# Patient Record
Sex: Male | Born: 1960
Health system: Southern US, Community
[De-identification: ages and names within clinical notes are randomized; demographics above are authoritative.]

## PROBLEM LIST (undated history)

## (undated) DIAGNOSIS — N471 Phimosis: Secondary | ICD-10-CM

## (undated) DIAGNOSIS — I10 Essential (primary) hypertension: Secondary | ICD-10-CM

## (undated) DIAGNOSIS — M1990 Unspecified osteoarthritis, unspecified site: Secondary | ICD-10-CM

## (undated) DIAGNOSIS — R7303 Prediabetes: Secondary | ICD-10-CM

## (undated) DIAGNOSIS — R3129 Other microscopic hematuria: Secondary | ICD-10-CM

---

## 1999-07-20 ENCOUNTER — Ambulatory Visit (HOSPITAL_BASED_OUTPATIENT_CLINIC_OR_DEPARTMENT_OTHER): Admission: RE | Admit: 1999-07-20 | Discharge: 1999-07-20 | Payer: Self-pay | Admitting: Urology

## 2000-01-23 ENCOUNTER — Other Ambulatory Visit: Admission: RE | Admit: 2000-01-23 | Discharge: 2000-01-23 | Payer: Self-pay | Admitting: Urology

## 2001-02-10 ENCOUNTER — Other Ambulatory Visit: Admission: RE | Admit: 2001-02-10 | Discharge: 2001-02-10 | Payer: Self-pay | Admitting: Urology

## 2001-06-07 ENCOUNTER — Emergency Department (HOSPITAL_COMMUNITY): Admission: EM | Admit: 2001-06-07 | Discharge: 2001-06-07 | Payer: Self-pay | Admitting: Emergency Medicine

## 2001-08-27 ENCOUNTER — Other Ambulatory Visit: Admission: RE | Admit: 2001-08-27 | Discharge: 2001-08-27 | Payer: Self-pay | Admitting: Urology

## 2005-06-28 ENCOUNTER — Ambulatory Visit: Admission: RE | Admit: 2005-06-28 | Discharge: 2005-06-28 | Payer: Self-pay | Admitting: Internal Medicine

## 2005-09-20 ENCOUNTER — Encounter: Admission: RE | Admit: 2005-09-20 | Discharge: 2005-09-20 | Payer: Self-pay | Admitting: Family Medicine

## 2012-11-25 ENCOUNTER — Other Ambulatory Visit: Payer: Self-pay | Admitting: Urology

## 2012-12-10 ENCOUNTER — Encounter (HOSPITAL_BASED_OUTPATIENT_CLINIC_OR_DEPARTMENT_OTHER): Payer: Self-pay | Admitting: *Deleted

## 2012-12-10 NOTE — Progress Notes (Signed)
NPO AFTER MN. ARRIVES AT 0730. NEEDS ISTAT AND EKG.  

## 2012-12-15 ENCOUNTER — Ambulatory Visit (HOSPITAL_BASED_OUTPATIENT_CLINIC_OR_DEPARTMENT_OTHER)
Admission: RE | Admit: 2012-12-15 | Discharge: 2012-12-15 | Disposition: A | Discharge status: Home / Self Care | Payer: 59 | Source: Ambulatory Visit | Attending: Urology | Admitting: Urology

## 2012-12-15 ENCOUNTER — Encounter (HOSPITAL_BASED_OUTPATIENT_CLINIC_OR_DEPARTMENT_OTHER)
Admission: RE | Disposition: A | Discharge status: Home / Self Care | Payer: Self-pay | Source: Ambulatory Visit | Attending: Urology

## 2012-12-15 ENCOUNTER — Ambulatory Visit (HOSPITAL_BASED_OUTPATIENT_CLINIC_OR_DEPARTMENT_OTHER): Payer: 59 | Admitting: Anesthesiology

## 2012-12-15 ENCOUNTER — Encounter (HOSPITAL_BASED_OUTPATIENT_CLINIC_OR_DEPARTMENT_OTHER): Payer: Self-pay | Admitting: Anesthesiology

## 2012-12-15 DIAGNOSIS — Z79899 Other long term (current) drug therapy: Secondary | ICD-10-CM | POA: Insufficient documentation

## 2012-12-15 DIAGNOSIS — N476 Balanoposthitis: Secondary | ICD-10-CM | POA: Insufficient documentation

## 2012-12-15 DIAGNOSIS — I10 Essential (primary) hypertension: Secondary | ICD-10-CM | POA: Insufficient documentation

## 2012-12-15 DIAGNOSIS — N471 Phimosis: Secondary | ICD-10-CM | POA: Insufficient documentation

## 2012-12-15 DIAGNOSIS — N478 Other disorders of prepuce: Secondary | ICD-10-CM | POA: Insufficient documentation

## 2012-12-15 HISTORY — DX: Prediabetes: R73.03

## 2012-12-15 HISTORY — DX: Unspecified osteoarthritis, unspecified site: M19.90

## 2012-12-15 HISTORY — DX: Phimosis: N47.1

## 2012-12-15 HISTORY — DX: Other microscopic hematuria: R31.29

## 2012-12-15 HISTORY — PX: CIRCUMCISION: SHX1350

## 2012-12-15 HISTORY — DX: Essential (primary) hypertension: I10

## 2012-12-15 LAB — POCT I-STAT, CHEM 8
Creatinine, Ser: 0.9 mg/dL (ref 0.50–1.35)
Glucose, Bld: 110 mg/dL — ABNORMAL HIGH (ref 70–99)
HCT: 46 % (ref 39.0–52.0)
Hemoglobin: 15.6 g/dL (ref 13.0–17.0)
Potassium: 4.1 mEq/L (ref 3.5–5.1)
Sodium: 142 mEq/L (ref 135–145)
TCO2: 28 mmol/L (ref 0–100)

## 2012-12-15 SURGERY — CIRCUMCISION, ADULT
Anesthesia: General | Site: Penis | Wound class: Clean

## 2012-12-15 MED ORDER — PROPOFOL 10 MG/ML IV BOLUS
INTRAVENOUS | Status: DC | PRN
  Administered 2012-12-15: 200 mg via INTRAVENOUS

## 2012-12-15 MED ORDER — MIDAZOLAM HCL 5 MG/5ML IJ SOLN
INTRAMUSCULAR | Status: DC | PRN
  Administered 2012-12-15: 2 mg via INTRAVENOUS

## 2012-12-15 MED ORDER — PROMETHAZINE HCL 25 MG/ML IJ SOLN
6.2500 mg | INTRAMUSCULAR | Status: DC | PRN
  Filled 2012-12-15: qty 1

## 2012-12-15 MED ORDER — BACITRACIN-NEOMYCIN-POLYMYXIN 400-5-5000 EX OINT
TOPICAL_OINTMENT | CUTANEOUS | Status: DC | PRN
  Administered 2012-12-15: 1 via TOPICAL

## 2012-12-15 MED ORDER — ACETAMINOPHEN 10 MG/ML IV SOLN
1000.0000 mg | Freq: Once | INTRAVENOUS | Status: DC | PRN
  Filled 2012-12-15: qty 100

## 2012-12-15 MED ORDER — MEPERIDINE HCL 25 MG/ML IJ SOLN
6.2500 mg | INTRAMUSCULAR | Status: DC | PRN
  Filled 2012-12-15: qty 1

## 2012-12-15 MED ORDER — SODIUM CHLORIDE 0.9 % IR SOLN
Status: DC | PRN
  Administered 2012-12-15: 500 mL

## 2012-12-15 MED ORDER — DEXAMETHASONE SODIUM PHOSPHATE 4 MG/ML IJ SOLN
INTRAMUSCULAR | Status: DC | PRN
  Administered 2012-12-15: 10 mg via INTRAVENOUS

## 2012-12-15 MED ORDER — OXYCODONE HCL 5 MG PO TABS
5.0000 mg | ORAL_TABLET | Freq: Once | ORAL | Status: DC | PRN
  Filled 2012-12-15: qty 1

## 2012-12-15 MED ORDER — BUPIVACAINE HCL (PF) 0.25 % IJ SOLN
INTRAMUSCULAR | Status: DC | PRN
  Administered 2012-12-15: 10 mL

## 2012-12-15 MED ORDER — LACTATED RINGERS IV SOLN
INTRAVENOUS | Status: DC
  Administered 2012-12-15 (×3): via INTRAVENOUS
  Filled 2012-12-15: qty 1000

## 2012-12-15 MED ORDER — FENTANYL CITRATE 0.05 MG/ML IJ SOLN
INTRAMUSCULAR | Status: DC | PRN
  Administered 2012-12-15: 100 ug via INTRAVENOUS

## 2012-12-15 MED ORDER — OXYCODONE HCL 5 MG/5ML PO SOLN
5.0000 mg | Freq: Once | ORAL | Status: DC | PRN
  Filled 2012-12-15: qty 5

## 2012-12-15 MED ORDER — ONDANSETRON HCL 4 MG/2ML IJ SOLN
INTRAMUSCULAR | Status: DC | PRN
  Administered 2012-12-15: 4 mg via INTRAVENOUS

## 2012-12-15 MED ORDER — HYDROMORPHONE HCL PF 1 MG/ML IJ SOLN
0.2500 mg | INTRAMUSCULAR | Status: DC | PRN
  Filled 2012-12-15: qty 1

## 2012-12-15 SURGICAL SUPPLY — 25 items
BANDAGE CO FLEX L/F 1IN X 5YD (GAUZE/BANDAGES/DRESSINGS) IMPLANT
BANDAGE CONFORM 2  STR LF (GAUZE/BANDAGES/DRESSINGS) ×4 IMPLANT
BLADE SURG 15 STRL LF DISP TIS (BLADE) ×1 IMPLANT
BLADE SURG 15 STRL SS (BLADE) ×1
BNDG COHESIVE 1X5 TAN STRL LF (GAUZE/BANDAGES/DRESSINGS) ×2 IMPLANT
CLOTH BEACON ORANGE TIMEOUT ST (SAFETY) ×2 IMPLANT
COVER MAYO STAND STRL (DRAPES) ×2 IMPLANT
COVER TABLE BACK 60X90 (DRAPES) ×2 IMPLANT
DRAPE PED LAPAROTOMY (DRAPES) ×2 IMPLANT
ELECT REM PT RETURN 9FT ADLT (ELECTROSURGICAL) ×2
ELECTRODE REM PT RTRN 9FT ADLT (ELECTROSURGICAL) ×1 IMPLANT
GAUZE VASELINE 1X8 (GAUZE/BANDAGES/DRESSINGS) ×2 IMPLANT
GLOVE BIO SURGEON STRL SZ7 (GLOVE) ×4 IMPLANT
GLOVE INDICATOR 6.5 STRL GRN (GLOVE) ×2 IMPLANT
GLOVE INDICATOR 7.0 STRL GRN (GLOVE) ×2 IMPLANT
GOWN PREVENTION PLUS LG XLONG (DISPOSABLE) ×2 IMPLANT
NEEDLE HYPO 25X1 1.5 SAFETY (NEEDLE) ×2 IMPLANT
PACK BASIN DAY SURGERY FS (CUSTOM PROCEDURE TRAY) ×2 IMPLANT
PENCIL BUTTON HOLSTER BLD 10FT (ELECTRODE) ×2 IMPLANT
SUT CHROMIC 4 0 P 3 18 (SUTURE) IMPLANT
SUT CHROMIC 5 0 RB 1 27 (SUTURE) IMPLANT
SYR CONTROL 10ML LL (SYRINGE) ×2 IMPLANT
TOWEL OR 17X24 6PK STRL BLUE (TOWEL DISPOSABLE) ×4 IMPLANT
TRAY DSU PREP LF (CUSTOM PROCEDURE TRAY) ×2 IMPLANT
WATER STERILE IRR 500ML POUR (IV SOLUTION) ×2 IMPLANT

## 2012-12-15 NOTE — Op Note (Signed)
Alfred Tucker is a 52 y.o.   12/15/2012  Pre-op diagnosis: Phimosis, balanitis  Postop diagnosis: Same  Procedure done: Circumcision  Surgeon: Wendie Simmer. Alfred Tucker  Anesthesia: General  Indication: Patient is a 52 years old male who has been complaining of difficulty retracting his foreskin for several years. He also has been noticing some laceration on the foreskin. He was found physical examination to have phimosis. He is scheduled today for circumcision  Procedure: The patient was identified by his wrist band and proper timeout was taken.  Under general anesthesia he was prepped and draped and placed in the supine position. A penile block was done with 0.25% Marcaine. Then 2 circumferential incisions were made on the foreskin and the foreskin in between those 2 incisions was excised. A frenulotomy was done. Hemostasis was secured with electrocautery. Skin approximation was then done with #4-0 chromic. Sterile eye dressing was then applied to the wound.  The patient tolerated the procedure well and left the OR in satisfactory condition to postanesthesia care unit.  CC: Dr. Renaye Rakers

## 2012-12-15 NOTE — Anesthesia Preprocedure Evaluation (Addendum)
Anesthesia Evaluation  Patient identified by MRN, date of birth, ID band Patient awake    Reviewed: Allergy & Precautions, H&P , NPO status , Patient's Chart, lab work & pertinent test results  Airway Mallampati: II TM Distance: >3 FB Neck ROM: Full    Dental  (+) Dental Advisory Given and Teeth Intact   Pulmonary neg pulmonary ROS,  breath sounds clear to auscultation        Cardiovascular hypertension, Pt. on medications Rhythm:Regular Rate:Normal     Neuro/Psych negative neurological ROS  negative psych ROS   GI/Hepatic negative GI ROS, Neg liver ROS,   Endo/Other  negative endocrine ROS  Renal/GU negative Renal ROS     Musculoskeletal negative musculoskeletal ROS (+)   Abdominal   Peds  Hematology negative hematology ROS (+)   Anesthesia Other Findings   Reproductive/Obstetrics                          Anesthesia Physical Anesthesia Plan  ASA: II  Anesthesia Plan: General   Post-op Pain Management:    Induction: Intravenous  Airway Management Planned: LMA  Additional Equipment:   Intra-op Plan:   Post-operative Plan: Extubation in OR  Informed Consent: I have reviewed the patients History and Physical, chart, labs and discussed the procedure including the risks, benefits and alternatives for the proposed anesthesia with the patient or authorized representative who has indicated his/her understanding and acceptance.   Dental advisory given  Plan Discussed with: CRNA  Anesthesia Plan Comments:         Anesthesia Quick Evaluation

## 2012-12-15 NOTE — Anesthesia Procedure Notes (Signed)
Procedure Name: LMA Insertion Date/Time: 12/15/2012 8:50 AM Performed by: Norva Pavlov Pre-anesthesia Checklist: Patient identified, Emergency Drugs available, Suction available and Patient being monitored Patient Re-evaluated:Patient Re-evaluated prior to inductionOxygen Delivery Method: Circle System Utilized Preoxygenation: Pre-oxygenation with 100% oxygen Intubation Type: IV induction Ventilation: Mask ventilation without difficulty LMA: LMA inserted LMA Size: 4.0 Number of attempts: 1 Airway Equipment and Method: bite block Placement Confirmation: positive ETCO2 Tube secured with: Tape Dental Injury: Teeth and Oropharynx as per pre-operative assessment

## 2012-12-15 NOTE — Anesthesia Postprocedure Evaluation (Signed)
Anesthesia Post Note  Patient: Alfred Tucker  Procedure(s) Performed: Procedure(s) (LRB): CIRCUMCISION ADULT (N/A)  Anesthesia type: General  Patient location: PACU  Post pain: Pain level controlled  Post assessment: Post-op Vital signs reviewed  Last Vitals: BP 137/82  Pulse 57  Temp(Src) 36.1 C (Oral)  Resp 18  Ht 5\' 6"  (1.676 m)  Wt 180 lb (81.647 kg)  BMI 29.07 kg/m2  SpO2 100%  Post vital signs: Reviewed  Level of consciousness: sedated  Complications: No apparent anesthesia complications

## 2012-12-15 NOTE — H&P (Signed)
History of Present Illness  [name] is referred by Dr Renaye Rakers for consultation regarding circumcision.  He has been complaining of difficulty retracting his foreskin and lacerations of the foreskin for several years.  Hygiene is also an issue.  He wishes to have a circumcision.  He had requested to have it six years ago but he then changed his mind.  His symptoms are getting worse.   Current Meds 1. Enalapril Maleate 20 MG Oral Tablet; Therapy: 14Jan2014 to  Allergies Medication  1. Novocain SOLN  Family History Problems  1. Family history of  Blood In Urine 2. Paternal history of  Family Health Status - Mother's Age 41. Paternal history of  Family Health Status Number Of Children 4. Paternal history of  Father Deceased At Age ____ 5. Family history of  Nephrolithiasis 6. Maternal history of  Prostate Cancer V16.42  Social History Problems    Alcohol Use   Caffeine Use   Marital History - Widowed   Occupation:   Tobacco Use 305.1  Review of Systems Genitourinary, constitutional, skin, eye, otolaryngeal, hematologic/lymphatic, cardiovascular, pulmonary, endocrine, musculoskeletal, gastrointestinal, neurological and psychiatric system(s) were reviewed and pertinent findings if present are noted.    Vitals Vital Signs [Data Includes: Last 1 Day]  04Feb2014 02:50PM  BMI Calculated: 30.05 BSA Calculated: 1.95 Height: 5 ft 6 in Weight: 187 lb  Blood Pressure: 128 / 82 Heart Rate: 62 Respiration: 18  Physical Exam Constitutional: Well nourished and well developed . No acute distress.  ENT:. The ears and nose are normal in appearance.  Neck: The appearance of the neck is normal and no neck mass is present.  Pulmonary: No respiratory distress and normal respiratory rhythm and effort.  Cardiovascular: Heart rate and rhythm are normal . No peripheral edema.  Abdomen: The abdomen is soft and nontender. No masses are palpated. No CVA tenderness. No hernias are palpable. No  hepatosplenomegaly noted.  Rectal: Rectal exam demonstrates normal sphincter tone, no tenderness and no masses. The prostate has no nodularity and is not tender. The left seminal vesicle is nonpalpable. The right seminal vesicle is nonpalpable. The perineum is normal on inspection.  Genitourinary: Examination of the penis demonstrates no discharge, no masses, no lesions and a normal meatus. The penis is uncircumcised. The scrotum is without lesions. Examination of the right scrotum demonstrates no hydrocele. Examination of the left scrotum demostrates no hydrocele. The right epididymis is palpably normal and non-tender. The left epididymis is palpably normal and non-tender. The right testis is palpably normal, non-tender and without masses. The left testis is normal, non-tender and without masses.  Lymphatics: The femoral and inguinal nodes are not enlarged or tender.  Skin: Normal skin turgor, no visible rash and no visible skin lesions.  Neuro/Psych:. Mood and affect are appropriate.    Results/Data Urine [Data Includes: Last 1 Day]   04Feb2014  COLOR YELLOW   APPEARANCE CLEAR   SPECIFIC GRAVITY 1.015   pH 8.0   GLUCOSE NEG mg/dL  BILIRUBIN NEG   KETONE NEG mg/dL  BLOOD SMALL   PROTEIN NEG mg/dL  UROBILINOGEN 0.2 mg/dL  NITRITE NEG   LEUKOCYTE ESTERASE NEG   SQUAMOUS EPITHELIAL/HPF RARE   WBC NONE SEEN WBC/hpf  RBC 3-6 RBC/hpf  BACTERIA NONE SEEN   CRYSTALS NONE SEEN   CASTS NONE SEEN    Assessment Assessed  1. Phimosis 605  Plan Health Maintenance (V70.0)  1. UA With REFLEX  Done: 04Feb2014 02:37PM Phimosis (605)  2. Follow-up Schedule Surgery Office  Follow-up  Done: 04Feb2014   Circumcision.  The procedure, risks, benefits were explained to him.  The risks include but are not limited to hemorrhage, infection, hematoma, skin loss.  He understands and wishes to proceed.   Signatures  CC: Dr Renaye Rakers  Electronically signed by : Su Grand, M.D.; Nov 24 2012  3:19PM

## 2012-12-15 NOTE — Transfer of Care (Signed)
Immediate Anesthesia Transfer of Care Note  Patient: Alfred Tucker  Procedure(s) Performed: Procedure(s) (LRB): CIRCUMCISION ADULT (N/A)  Patient Location: PACU  Anesthesia Type: General  Level of Consciousness: sleepy  Airway & Oxygen Therapy: Patient Spontanous Breathing and Patient connected to face mask oxygen  Post-op Assessment: Report given to PACU RN and Post -op Vital signs reviewed and stable  Post vital signs: Reviewed and stable  Complications: No apparent anesthesia complications

## 2012-12-16 ENCOUNTER — Encounter (HOSPITAL_BASED_OUTPATIENT_CLINIC_OR_DEPARTMENT_OTHER): Payer: Self-pay | Admitting: Urology

## 2014-10-07 ENCOUNTER — Ambulatory Visit
Admission: RE | Admit: 2014-10-07 | Discharge: 2014-10-07 | Disposition: A | Discharge status: Home / Self Care | Payer: 59 | Source: Ambulatory Visit | Attending: Family Medicine | Admitting: Family Medicine

## 2014-10-07 ENCOUNTER — Encounter (INDEPENDENT_AMBULATORY_CARE_PROVIDER_SITE_OTHER): Payer: Self-pay

## 2014-10-07 ENCOUNTER — Other Ambulatory Visit: Payer: Self-pay | Admitting: Family Medicine

## 2014-10-07 DIAGNOSIS — R52 Pain, unspecified: Secondary | ICD-10-CM

## 2015-04-26 ENCOUNTER — Ambulatory Visit
Admission: RE | Admit: 2015-04-26 | Discharge: 2015-04-26 | Disposition: A | Discharge status: Home / Self Care | Payer: Commercial Managed Care - HMO | Source: Ambulatory Visit | Attending: Family Medicine | Admitting: Family Medicine

## 2015-04-26 ENCOUNTER — Other Ambulatory Visit: Payer: Self-pay | Admitting: Family Medicine

## 2015-04-26 DIAGNOSIS — M25532 Pain in left wrist: Secondary | ICD-10-CM

## 2015-11-22 ENCOUNTER — Ambulatory Visit (INDEPENDENT_AMBULATORY_CARE_PROVIDER_SITE_OTHER): Payer: Commercial Managed Care - HMO | Admitting: Podiatry

## 2015-11-22 ENCOUNTER — Encounter: Payer: Self-pay | Admitting: Podiatry

## 2015-11-22 VITALS — BP 110/65 | HR 76 | Resp 14

## 2015-11-22 DIAGNOSIS — M129 Arthropathy, unspecified: Secondary | ICD-10-CM | POA: Diagnosis not present

## 2015-11-22 DIAGNOSIS — M19071 Primary osteoarthritis, right ankle and foot: Secondary | ICD-10-CM

## 2015-11-22 MED ORDER — OXAPROZIN 600 MG PO TABS: 600.0000 mg | ORAL_TABLET | Freq: Every day | ORAL | Status: DC

## 2015-11-22 MED ORDER — OXAPROZIN 600 MG PO TABS: 600.0000 mg | ORAL_TABLET | Freq: Two times a day (BID) | ORAL | Status: DC

## 2015-11-22 NOTE — Addendum Note (Signed)
Addended by: Harlon Flor, Chinyere Galiano L on: 11/22/2015 04:18 PM   Modules accepted: Orders

## 2015-11-22 NOTE — Progress Notes (Addendum)
   Subjective:    Patient ID: Alfred Tucker, male    DOB: Oct 27, 1960, 55 y.o.   MRN: [id number]  HPI this patient presents the office with chief complaint of a severely swollen right foot yesterday. He states that the pain has lessened and is approximately 50% of what it was yesterday. He is capable of walking today where as yesterday he was unable to bear weight. He states that one and a half months ago. he was treated by his medical doctor for gout. He states that that improved over time but now the pain has re occurred. He points to the bony prominence on the inside aspect of his right foot as the site of most pain. He also says that he has the inability to raise his big toe about 45 degrees , right foot  before the pain and  the swelling become unbearable.  He has provided no self treatment or sought professional help.  He admits that bloodwork was previously performed and showed no problems.    Review of Systems  All other systems reviewed and are negative.      Objective:   Physical Exam GENERAL APPEARANCE: Alert, conversant. Appropriately groomed. No acute distress.  VASCULAR: Pedal pulses palpable at  [facility] and PT bilateral.  Capillary refill time is immediate to all digits,  Normal temperature gradient.  Digital hair growth is present bilateral  NEUROLOGIC: sensation is normal to 5.07 monofilament at 5/5 sites bilateral.  Light touch is intact bilateral, Muscle strength normal.  MUSCULOSKELETAL: acceptable muscle strength, tone and stability bilateral.  Intrinsic muscluature intact bilateral.  Rectus appearance of foot and digits noted bilateral. Pain and swelling big toe joint right foot with limited ROM.  His whole right foot is swollen with increased swelling dorsum right foot.  Palpable pain at the dorsomedial exostosis right foot.  DERMATOLOGIC: skin color, texture  and turgor are within normal limits.  No preulcerative lesions or ulcers  are seen, no interdigital maceration noted.  No  open lesions present.  Digital nails are asymptomatic. No drainage noted.         Assessment & Plan:  Arthritic flare-up right foot.  Possible gout.   IE  Xrays deferred until Friday.  Treated his acute flare-up with Daypro 600 mg.  # 30. To consider surgical shoe and/or injection therapy. He is allergic to tramadol.   Helane Gunther DPM

## 2015-11-24 ENCOUNTER — Encounter: Payer: Self-pay | Admitting: Podiatry

## 2015-11-24 ENCOUNTER — Ambulatory Visit (INDEPENDENT_AMBULATORY_CARE_PROVIDER_SITE_OTHER): Payer: Commercial Managed Care - HMO

## 2015-11-24 ENCOUNTER — Ambulatory Visit (INDEPENDENT_AMBULATORY_CARE_PROVIDER_SITE_OTHER): Payer: Commercial Managed Care - HMO | Admitting: Podiatry

## 2015-11-24 VITALS — BP 133/66 | HR 64 | Resp 14

## 2015-11-24 DIAGNOSIS — M129 Arthropathy, unspecified: Secondary | ICD-10-CM

## 2015-11-24 DIAGNOSIS — R52 Pain, unspecified: Secondary | ICD-10-CM | POA: Diagnosis not present

## 2015-11-24 DIAGNOSIS — M2011 Hallux valgus (acquired), right foot: Secondary | ICD-10-CM

## 2015-11-24 DIAGNOSIS — M19071 Primary osteoarthritis, right ankle and foot: Secondary | ICD-10-CM

## 2015-11-24 NOTE — Progress Notes (Signed)
Subjective:     Patient ID: Alfred Tucker, male   DOB: May 24, 1961, 55 y.o.   MRN: [id number]  HPI this patient presents to the office 2 days after being seen at [facility]. He was diagnosed with an unknown arthritis affecting and causing pain and swelling to his whole right foot. His pain was centered on the bunion of the right foot. Upon my examination. He was prescribed Daypro to be taken to eliminate the pain and  the swelling. He was also given an appointment at this office for an x-ray . He presents to the office today stating that his right foot is 90% improved. He is very pleased and presents for his x-rays   Review of Systems     Objective:   Physical Exam GENERAL APPEARANCE: Alert, conversant. Appropriately groomed. No acute distress.  VASCULAR: Pedal pulses palpable at  [facility] and PT bilateral.  Capillary refill time is immediate to all digits,  Normal temperature gradient.  Digital hair growth is present bilateral  NEUROLOGIC: sensation is normal to 5.07 monofilament at 5/5 sites bilateral.  Light touch is intact bilateral, Muscle strength normal.  MUSCULOSKELETAL: acceptable muscle strength, tone and stability bilateral.  Intrinsic muscluature intact bilateral.  Rectus appearance of foot and digits noted bilateral. Swelling and redness has subsided.  Pain persists at dorsomedial exostosis right foot.  DERMATOLOGIC: skin color, texture, and turgor are within normal limits.  No preulcerative lesions or ulcers  are seen, no interdigital maceration noted.  No open lesions present.  Digital nails are asymptomatic. No drainage noted.      Assessment:     Hallux  Valgus right foot.  Possible gout     Plan:     ROV  Xray reveal mild radiographic changes.  Told to continue taking the Daypro.  Offered injection therapy but patient declined.  Dispense surgical shoe  To use as needed.   Helane Gunther DPM

## 2015-12-01 ENCOUNTER — Ambulatory Visit (INDEPENDENT_AMBULATORY_CARE_PROVIDER_SITE_OTHER): Payer: Commercial Managed Care - HMO | Admitting: Physician Assistant

## 2015-12-01 VITALS — BP 126/84 | HR 57 | Temp 97.9°F | Resp 18 | Ht 65.5 in | Wt 185.2 lb

## 2015-12-01 DIAGNOSIS — E119 Type 2 diabetes mellitus without complications: Secondary | ICD-10-CM | POA: Diagnosis not present

## 2015-12-01 LAB — COMPREHENSIVE METABOLIC PANEL
ALK PHOS: 35 U/L — AB (ref 40–115)
ALT: 23 U/L (ref 9–46)
AST: 25 U/L (ref 10–35)
Albumin: 4.4 g/dL (ref 3.6–5.1)
BILIRUBIN TOTAL: 0.6 mg/dL (ref 0.2–1.2)
BUN: 12 mg/dL (ref 7–25)
CALCIUM: 9.8 mg/dL (ref 8.6–10.3)
CO2: 28 mmol/L (ref 20–31)
Chloride: 104 mmol/L (ref 98–110)
Creat: 0.9 mg/dL (ref 0.70–1.33)
GLUCOSE: 105 mg/dL — AB (ref 65–99)
Potassium: 5 mmol/L (ref 3.5–5.3)
Sodium: 138 mmol/L (ref 135–146)
TOTAL PROTEIN: 6.7 g/dL (ref 6.1–8.1)

## 2015-12-01 LAB — POCT GLYCOSYLATED HEMOGLOBIN (HGB A1C): HEMOGLOBIN A1C: 130

## 2015-12-01 LAB — GLUCOSE, POCT (MANUAL RESULT ENTRY): POC Glucose: 6.7 mg/dl — AB (ref 70–99)

## 2015-12-01 MED ORDER — METFORMIN HCL 500 MG PO TABS: 500.0000 mg | ORAL_TABLET | Freq: Every day | ORAL | Status: DC

## 2015-12-01 NOTE — Progress Notes (Signed)
[facility] [address], [place] [address] [phone]  Date:  12/01/2015   Name:  Alfred Tucker   DOB:  1960/11/06   MRN:  [id number]  PCP:  Geraldo Pitter, MD    Chief Complaint: Diabetes   History of Present Illness:  This is a 55 y.o. male with PMH HTN who is presenting for a second opinion on diabetes. Sees Dr. Arvella Nigh at [facility]. 2 weeks ago she diagnosed him with DM. States his A1C was 6.7. He was placed on sample of jentadueto XR -- metformin 1000 mg/linagliptin 5 mg QD. States she instructed him to take for 2 weeks and let her know how she did with that. He states in general he did well with it. No GI upset. States yesterday when he stopped the medicine he started feeling dizzy. He stumbled in the bathroom. He went to see the nurse at work and she tested his bg which was 45. She told him she didn't think he had diabetes. He states diabetes runs in his family For the past 4 weeks he has been exercising 4-5 days a week for 45-60 minutes at a time. He has been eating much healthier, avoiding excess carbs and drinking only water. Avoiding sweets.  Just had an eye appt. Denies paresthesias, cp, sob, abdominal pain, blurred vision  Review of Systems:  Review of Systems See HPI  There are no active problems to display for this patient.   Prior to Admission medications   Medication Sig Start Date End Date Taking? Authorizing Provider  enalapril (VASOTEC) 20 MG tablet Take 20 mg by mouth daily.   Yes Historical Provider, MD  oxaprozin (DAYPRO) 600 MG tablet Take 1 tablet (600 mg total) by mouth 2 (two) times daily. 11/22/15  Yes Helane Gunther, DPM    Allergies  Allergen Reactions  . Novocain [Procaine Hcl] Itching  . Tramadol Itching    Past Surgical History  Procedure Laterality Date  . Circumcision N/A 12/15/2012    Procedure: CIRCUMCISION ADULT;  Surgeon: Lindaann Slough, MD;  Location: [facility];  Service: Urology;   Laterality: N/A;    Social History  Substance Use Topics  . Smoking status: Former Smoker -- 0.25 packs/day for 10 years    Types: Cigarettes    Quit date: 12/10/1992  . Smokeless tobacco: Never Used  . Alcohol Use: Yes     Comment: occasional    Family History  Problem Relation Age of Onset  . Diabetes Mother   . Heart disease Mother   . Diabetes Father   . Stroke Sister   . Diabetes Sister   . Stroke Brother   . Diabetes Sister   . Diabetes Brother     Medication list has been reviewed and updated.  Physical Examination:  Physical Exam  Constitutional: He is oriented to person, place, and time. He appears well-developed and well-nourished. No distress.  HENT:  Head: Normocephalic and atraumatic.  Right Ear: Hearing normal.  Left Ear: Hearing normal.  Nose: Nose normal.  Eyes: Conjunctivae and lids are normal. Right eye exhibits no discharge. Left eye exhibits no discharge. No scleral icterus.  Cardiovascular: Normal rate, regular rhythm, normal heart sounds and normal pulses.   No murmur heard. Pulmonary/Chest: Effort normal and breath sounds normal. No respiratory distress. He has no wheezes. He has no rhonchi. He has no rales.  Musculoskeletal: Normal range of motion.  Neurological: He is alert and oriented to person, place, and  time.  Skin: Skin is warm, dry and intact. No lesion and no rash noted.  Psychiatric: He has a normal mood and affect. His speech is normal and behavior is normal. Thought content normal.   BP 126/84 mmHg  Pulse 57  Temp(Src) 97.9 F (36.6 C) (Oral)  Resp 18  Ht 5' 5.5" (1.664 m)  Wt 185 lb 3.2 oz (84.006 kg)  BMI 30.34 kg/m2  SpO2 98%  Results for orders placed or performed in visit on 12/01/15  POCT glycosylated hemoglobin (Hb A1C)  Result Value Ref Range   Hemoglobin A1C 130   POCT glucose (manual entry)  Result Value Ref Range   POC Glucose 6.7 (A) 70 - 99 mg/dl   Assessment and Plan:  1. Type 2 diabetes mellitus  without complication, without long-term current use of insulin (HCC) I do not think pt needs to be on such a strong diabetes medicine with an A1C of only 6.7. He opted to be on low dose metformin -- 500 mg QAM. Counseled on diet and weight loss. Continue exercising regularly. Pt wants to establish care with new PCP but he is not sure if he wants to establish here. Gave 3 months of medicine while he takes time to find a PCP to establish with. - POCT glycosylated hemoglobin (Hb A1C) - POCT glucose (manual entry) - Comprehensive metabolic panel - metFORMIN (GLUCOPHAGE) 500 MG tablet; Take 1 tablet (500 mg total) by mouth daily with breakfast.  Dispense: 90 tablet; Refill: 0   [place] [employer]. Dyke Brackett, MHS [facility]  12/01/2015

## 2015-12-01 NOTE — Patient Instructions (Signed)
Continue with diet and exercise. Foods you wants to avoid are white (potatoes, rice, bread, pasta), sugary beverages and sweets. Take metformin once a day with breakfast. Make an appointment with a primary care provider in 3 months.

## 2016-05-28 ENCOUNTER — Encounter: Payer: Self-pay | Admitting: Podiatry

## 2016-05-28 ENCOUNTER — Ambulatory Visit (INDEPENDENT_AMBULATORY_CARE_PROVIDER_SITE_OTHER): Payer: Commercial Managed Care - HMO | Admitting: Sports Medicine

## 2016-05-28 VITALS — BP 118/72 | HR 60 | Resp 14

## 2016-05-28 DIAGNOSIS — M205X1 Other deformities of toe(s) (acquired), right foot: Secondary | ICD-10-CM | POA: Diagnosis not present

## 2016-05-28 DIAGNOSIS — M19071 Primary osteoarthritis, right ankle and foot: Secondary | ICD-10-CM

## 2016-05-28 DIAGNOSIS — M129 Arthropathy, unspecified: Secondary | ICD-10-CM | POA: Diagnosis not present

## 2016-05-28 DIAGNOSIS — M79671 Pain in right foot: Secondary | ICD-10-CM

## 2016-05-28 DIAGNOSIS — M2040 Other hammer toe(s) (acquired), unspecified foot: Secondary | ICD-10-CM | POA: Diagnosis not present

## 2016-05-28 MED ORDER — METHYLPREDNISOLONE 4 MG PO TBPK: ORAL_TABLET | ORAL | 0 refills | Status: DC

## 2016-05-28 NOTE — Patient Instructions (Signed)
Hallux Rigidus Hallux rigidus is a condition involving pain and a loss of motion of the first (big) toe. The pain gets worse with lifting up (extension) of the toe. This is usually due to arthritic bony bumps (spurring) of the joint at the base of the big toe.  SYMPTOMS   Pain, with lifting up of the toe.  Tenderness over the joint where the big toe meets the foot.  Redness, swelling, and warmth over the top of the base of the big toe (sometimes).  Foot pain, stiffness, and limping. CAUSES  Hallux rigidus is caused by arthritis of the joint where the big toe meets the foot. The arthritis creates a bone spur that pinches the soft tissues when the toe is extended. RISK INCREASES WITH:  Tight shoes with a narrow toe box.  Family history of foot problems.  Gout and rheumatoid and psoriatic arthritis.  History of previous toe injury, including "turf toe."  Long first toe, flat feet, and other big toe bony bumps.  Arthritis of the big toe. PREVENTION   Wear wide-toed shoes that fit well.  Tape the big toe to reduce motion and to prevent pinching of the tissues between the bone.  Maintain physical fitness:  Foot and ankle flexibility.  Muscle strength and endurance. PROGNOSIS  This condition can usually be managed with proper treatment. However, surgery is typically required to prevent the problem from recurring.  RELATED COMPLICATIONS  Injury to other areas of the foot or ankle, caused by abnormal walking in an attempt to avoid the pain felt when walking normally. TREATMENT Treatment first involves stopping the activities that aggravate your symptoms. Ice and medicine can be used to reduce the pain and inflammation. Modifications to shoes may help reduce pain, including wearing stiff-soled shoes, shoes with a wide toe box, inserting a padded donut to relieve pressure on top of the joint, or wearing an arch support. Corticosteroid injections may be given to reduce inflammation. If  nonsurgical treatment is unsuccessful, surgery may be needed. Surgical options include removing the arthritic bony spur, cutting a bone in the foot to change the arc of motion (allowing the toe to extend more), or fusion of the joint (eliminating all motion in the joint at the base of the big toe).  MEDICATION   If pain medicine is needed, nonsteroidal anti-inflammatory medicines (aspirin and ibuprofen), or other minor pain relievers (acetaminophen), are often advised.  Do not take pain medicine for 7 days before surgery.  Prescription pain relievers are usually prescribed only after surgery. Use only as directed and only as much as you need.  Ointments for arthritis, applied to the skin, may give some relief.  Injections of corticosteroids may be given to reduce inflammation. HEAT AND COLD  Cold treatment (icing) relieves pain and reduces inflammation. Cold treatment should be applied for 10 to 15 minutes every 2 to 3 hours, and immediately after activity that aggravates your symptoms. Use ice packs or an ice massage.  Heat treatment may be used before performing the stretching and strengthening activities prescribed by your caregiver, physical therapist, or athletic trainer. Use a heat pack or a warm water soak. SEEK MEDICAL CARE IF:   Symptoms get worse or do not improve in 2 weeks, despite treatment.  After surgery you develop fever, increasing pain, redness, swelling, drainage of fluids, bleeding, or increasing warmth.  New, unexplained symptoms develop. (Drugs used in treatment may produce side effects.)   This information is not intended to replace advice given to   you by your health care provider. Make sure you discuss any questions you have with your health care provider.   Document Released: 10/07/2005 Document Revised: 10/28/2014 Document Reviewed: 01/19/2009 Elsevier Interactive Patient Education 2016 [employer]  

## 2016-05-28 NOTE — Progress Notes (Signed)
Subjective: Alfred Tucker is a 55 y.o. male patient who presents to office for evaluation of Right big toe joint pain and noticing smaller toes curling. Patient complains of continued stiffness and swelling especially over the last year in the Right foot that starts as pain over the bump with direct pressure and range of motion;especially with work. Reports that inserts help and was told by PCP to get new shoes. Patient has been seeing Dr. Stacie Acres who gave anti-inflammatories and recommended injection of which the patient declined. Patient denies any other pedal complaints.   States that he is "borderline diabetic".   There are no active problems to display for this patient.   Current Outpatient Prescriptions on File Prior to Visit  Medication Sig Dispense Refill  . enalapril (VASOTEC) 20 MG tablet Take 20 mg by mouth daily.    . metFORMIN (GLUCOPHAGE) 500 MG tablet Take 1 tablet (500 mg total) by mouth daily with breakfast. 90 tablet 0  . oxaprozin (DAYPRO) 600 MG tablet Take 1 tablet (600 mg total) by mouth 2 (two) times daily. 30 tablet 0   No current facility-administered medications on file prior to visit.     Allergies  Allergen Reactions  . Novocain [Procaine Hcl] Itching  . Tramadol Itching    Objective:  General: Alert and oriented x3 in no acute distress  Dermatology: No open lesions bilateral lower extremities, no webspace macerations, no ecchymosis bilateral, all nails x 10 are well manicured.  Vascular: Dorsalis Pedis and Posterior Tibial pedal pulses 2/4, Capillary Fill Time 3 seconds, (+) pedal hair growth bilateral, no edema bilateral lower extremities, Temperature gradient within normal limits.  Neurology: Michaell Cowing sensation intact via light touch bilateral, Protective sensation intact  with Semmes Weinstein Monofilament to all pedal sites, Position sense intact, vibratory intact bilateral, Deep tendon reflexes within normal limits bilateral, No babinski sign present  bilateral. (-) Tinels sign bilateral.   Musculoskeletal: Mild tenderness with palpation right 1st MTPJ with limited end range of motion suggestive of limitus with early arthritis. Midtarsal, Subtalar joint, and ankle joint range of motion is within normal limits. Mild lesser hammertoe.        Assessment and Plan: Problem List Items Addressed This Visit    None    Visit Diagnoses    Arthritis of foot, right    -  Primary   Relevant Medications   methylPREDNISolone (MEDROL DOSEPAK) 4 MG TBPK tablet   Other Relevant Orders   CBC with Differential   Basic Metabolic Panel   Uric Acid   Sedimentation Rate   C-reactive protein   ANA, IFA Comprehensive Panel   Rheumatoid factor   Hallux limitus of right foot       Relevant Medications   methylPREDNISolone (MEDROL DOSEPAK) 4 MG TBPK tablet   Other Relevant Orders   CBC with Differential   Basic Metabolic Panel   Uric Acid   Sedimentation Rate   C-reactive protein   ANA, IFA Comprehensive Panel   Rheumatoid factor   Hammertoe, unspecified laterality       Relevant Medications   methylPREDNISolone (MEDROL DOSEPAK) 4 MG TBPK tablet   Other Relevant Orders   CBC with Differential   Basic Metabolic Panel   Uric Acid   Sedimentation Rate   C-reactive protein   ANA, IFA Comprehensive Panel   Rheumatoid factor   Right foot pain           -Complete examination performed -Previous Xrays reviewed -Discussed treatement options; discussed Hallux limitus deformity;conservative  and  Surgical management; risks, benefits, alternatives discussed. All patient's questions answered. -Patient declined steroid injection  -Rx Medrol dose pack -Arthritic panel for further eval; will call patient with results -Recommend good supportive shoes and applied felt extension on shoe liner and recommended patient to do the same until he can purchase new inserts -Patient to return to office as needed or sooner if condition worsens.  Asencion Islamitorya Nijee Heatwole, DPM

## 2016-06-04 LAB — CBC WITH DIFFERENTIAL/PLATELET
BASOS PCT: 0 %
Basophils Absolute: 0 cells/uL (ref 0–200)
EOS PCT: 1 %
Eosinophils Absolute: 91 cells/uL (ref 15–500)
HEMATOCRIT: 44.1 % (ref 38.5–50.0)
Hemoglobin: 14.4 g/dL (ref 13.2–17.1)
LYMPHS PCT: 21 %
Lymphs Abs: 1911 cells/uL (ref 850–3900)
MCH: 28.8 pg (ref 27.0–33.0)
MCHC: 32.7 g/dL (ref 32.0–36.0)
MCV: 88.2 fL (ref 80.0–100.0)
MONO ABS: 546 {cells}/uL (ref 200–950)
MONOS PCT: 6 %
MPV: 10.4 fL (ref 7.5–12.5)
Neutro Abs: 6552 cells/uL (ref 1500–7800)
Neutrophils Relative %: 72 %
PLATELETS: 212 10*3/uL (ref 140–400)
RBC: 5 MIL/uL (ref 4.20–5.80)
RDW: 14.4 % (ref 11.0–15.0)
WBC: 9.1 10*3/uL (ref 3.8–10.8)

## 2016-06-04 LAB — BASIC METABOLIC PANEL
BUN: 14 mg/dL (ref 7–25)
CHLORIDE: 106 mmol/L (ref 98–110)
CO2: 29 mmol/L (ref 20–31)
Calcium: 9.6 mg/dL (ref 8.6–10.3)
Creat: 0.88 mg/dL (ref 0.70–1.33)
Glucose, Bld: 101 mg/dL — ABNORMAL HIGH (ref 65–99)
POTASSIUM: 4.5 mmol/L (ref 3.5–5.3)
SODIUM: 142 mmol/L (ref 135–146)

## 2016-06-04 LAB — RHEUMATOID FACTOR: Rhuematoid fact SerPl-aCnc: <10 IU/mL (ref <=14)

## 2016-06-04 LAB — ANA, IFA COMPREHENSIVE PANEL
ANA: POSITIVE — AB
ENA SM Ab Ser-aCnc: <1
SCLERODERMA (SCL-70) (ENA) ANTIBODY, IGG: NEGATIVE
SM/RNP: <1
SSA (Ro) (ENA) Antibody, IgG: <1
SSB (La) (ENA) Antibody, IgG: <1

## 2016-06-04 LAB — URIC ACID: URIC ACID, SERUM: 7.3 mg/dL (ref 4.0–8.0)

## 2016-06-04 LAB — SEDIMENTATION RATE: SED RATE: 1 mm/h (ref 0–20)

## 2016-06-04 LAB — ANTI-NUCLEAR AB-TITER (ANA TITER)

## 2016-06-04 LAB — C-REACTIVE PROTEIN

## 2016-06-05 ENCOUNTER — Telehealth: Payer: Self-pay | Admitting: *Deleted

## 2016-06-05 DIAGNOSIS — M19071 Primary osteoarthritis, right ankle and foot: Secondary | ICD-10-CM

## 2016-06-05 DIAGNOSIS — M79671 Pain in right foot: Secondary | ICD-10-CM

## 2016-06-05 DIAGNOSIS — M205X1 Other deformities of toe(s) (acquired), right foot: Secondary | ICD-10-CM

## 2016-06-05 NOTE — Telephone Encounter (Addendum)
-----   Message from Asencion Islamitorya Stover, [place] sent at 06/04/2016  5:38 PM EDT ----- Can you let patient know that his ANA is elevated and place a rheumatology consult. Thanks Dr. Marylene LandStover. 06/05/2016-Informed pt of Dr. Wynema BirchStover's recommendations.

## 2017-01-24 DIAGNOSIS — R768 Other specified abnormal immunological findings in serum: Secondary | ICD-10-CM | POA: Insufficient documentation

## 2017-01-24 NOTE — Progress Notes (Signed)
Office Visit Note  Patient: Alfred Tucker             Date of Birth: May 07, 1961           MRN: [id number]             PCP: Elyn Peers, MD Referring: Lucianne Lei, MD Visit Date: 01/29/2017 Occupation: Supervisor and driver [employer]   Subjective:  Pain of the Right Foot and Follow-up   History of Present Illness: Alfred Tucker is a 56 y.o. male was seen last in August 2017 with right first MTP pain. At the time the differential was clinical gout versus osteoarthritis of the foot. He was advised to return back to the office in case he had further episodes of foot pain.  Today, patient is complaining of right first MTP pain at the medial aspect where he notices a not. Did not has been there since the last visit in August. It's been bothering him since then. When patient has a flare of the right foot pain in the first MTP joint, he rates her pain as 8 on a scale of 0-10. On average it's about 4-5 on a scale of 0-10. Patient states that when the swelling in the right foot happens, that's when it's most painful.  Patient works for [employer] [place]. At his workplace there is a Marine scientist. The nurse treated the patient with gout medication. Patient states that the gout medicine work. It appears that patient got a very small pill that he says was purple. It seems like he might have gotten colchicine. Patient did get relief after taking the medication for 3 days. He notices significant improvement just in short 3 days.  Reviewing his labs from 06/03/2016, he had a uric acid of about 7.3. He also had elevated ANA but no clinical features of autoimmune disease per Dr. Arlean Hopping notes. ANA titer was 1:80 with a nucleolar pattern.  Patient did have "flare " last week. Had fried chicken and ox tails.   Activities of Daily Living:  Patient reports morning stiffness for 15 minutes.   Patient Reports nocturnal pain.  Difficulty dressing/grooming: Denies Difficulty climbing stairs:  Reports Difficulty getting out of chair: Denies Difficulty using hands for taps, buttons, cutlery, and/or writing: Denies   Review of Systems  Constitutional: Negative for fatigue.  HENT: Negative for mouth sores and mouth dryness.   Eyes: Negative for dryness.  Respiratory: Negative for shortness of breath.   Gastrointestinal: Negative for constipation and diarrhea.  Musculoskeletal: Negative for myalgias and myalgias.  Skin: Negative for sensitivity to sunlight.  Neurological: Negative for memory loss.  Psychiatric/Behavioral: Negative for sleep disturbance.    PMFS History:  Patient Active Problem List   Diagnosis Date Noted  . Pain in right foot 01/26/2017  . Primary osteoarthritis of both feet 01/26/2017  . Hyperuricemia 01/26/2017  . History of diabetes mellitus 01/26/2017  . Microscopic hematuria 01/26/2017  . ANA positive 01/24/2017    Past Medical History:  Diagnosis Date  . Arthritis   . Borderline diabetes mellitus    diet controlled  . Hypertension   . Microhematuria   . Phimosis     Family History  Problem Relation Age of Onset  . Diabetes Mother   . Heart disease Mother   . Diabetes Father   . Stroke Sister   . Diabetes Sister   . Stroke Brother   . Diabetes Sister   . Diabetes Brother    Past Surgical History:  Procedure  Laterality Date  . CIRCUMCISION N/A 12/15/2012   Procedure: CIRCUMCISION ADULT;  Surgeon: Hanley Ben, MD;  Location: [facility];  Service: Urology;  Laterality: N/A;   Social History   Social History Narrative  . No narrative on file     Objective: Vital Signs: BP 134/76   Pulse 80   Resp 16   Ht _0  (1.676 m)   Wt 198 lb (89.8 kg)   BMI 31.96 kg/m    Physical Exam  Constitutional: He is oriented to person, place, and time. He appears well-developed and well-nourished.  HENT:  Head: Normocephalic and atraumatic.  Eyes: Conjunctivae and EOM are normal. Pupils are equal, round, and reactive to  light.  Neck: Normal range of motion. Neck supple.  Cardiovascular: Normal rate, regular rhythm and normal heart sounds.  Exam reveals no gallop and no friction rub.   No murmur heard. Pulmonary/Chest: Effort normal and breath sounds normal. No respiratory distress. He has no wheezes. He has no rales. He exhibits no tenderness.  Abdominal: Soft. He exhibits no distension and no mass. There is no tenderness. There is no guarding.  Musculoskeletal: Normal range of motion.  Lymphadenopathy:    He has no cervical adenopathy.  Neurological: He is alert and oriented to person, place, and time. He exhibits normal muscle tone. Coordination normal.  Skin: Skin is warm and dry. Capillary refill takes less than 2 seconds. No rash noted.  Psychiatric: He has a normal mood and affect. His behavior is normal. Judgment and thought content normal.  Vitals reviewed.    Musculoskeletal Exam:  FROM of all joints Grip strength equal and strong bilaterally Fms -- no tender points  CDAI Exam: CDAI Homunculus Exam:   Joint Counts:  CDAI Tender Joint count: 0 CDAI Swollen Joint count: 0  Global Assessments:  Patient Global Assessment: 6 Provider Global Assessment: 6  CDAI Calculated Score: 12    Investigation: Findings:  In August 2017, ANA was 1:80 nucleolar pattern.  ENA was negative.  Uric acid was 7.3.  ESR 1, CRP<0.5, RF negative.  CBC was normal.  BMP was normal.   X-rays of the right foot, according to Dr. Leeanne Rio notes, showed only a calcaneal spur.  06/12/2016 He has positive ANA but no clinical features of autoimmune disease.  We had a detailed discussion regarding that.  His uric acid is borderline elevated and not significant enough to put him on allopurinol at this point.  I would like to wait and see if he has any more episodes of gout.  If he has repeated episodes of inflammation in his right first toe or any other areas, it may be worth treating him with uricosuric  agents    Imaging: No results found.  Speciality Comments: No specialty comments available.    Procedures:  No procedures performed Allergies: Novocain [procaine hcl] and Tramadol   Assessment / Plan:     Visit Diagnoses: Pain in right foot - Questionable history of clinical gout, hallux rigidus - Plan: CBC with Differential/Platelet, COMPLETE METABOLIC PANEL WITH GFR, Uric acid  Primary osteoarthritis of both feet - Plan: CBC with Differential/Platelet, COMPLETE METABOLIC PANEL WITH GFR  Hyperuricemia - Plan: CBC with Differential/Platelet, COMPLETE METABOLIC PANEL WITH GFR, Uric acid  ANA positive - 1:80 nucleolar 06/04/2016  Essential hypertension  History of diabetes mellitus - Plan: COMPLETE METABOLIC PANEL WITH GFR  Microscopic hematuria   PLAN ==>  #1: Right great toe pain at the first MTP joint on the medial  aspect. Problem gout. History of hyperuricemia. See lab; August 2017. Autoimmune workup was done and was all negative except ANA was positive with a titer of 1:0 with a nucleolar pattern.  #2: Recently seen nurse who serves patient's workplace. She gave the patient colchicine. We are not sure exactly what was prescribed the patient calls it a purple pill that is very small. He took the medication for 3 days when he had a flare. The pain subsided in 3 days.  #3: Right heel pain Patient states that whenever he has the right great toe pain, he also has right heel pain. He gets worse when there is swelling of the right foot. We will observe for now and see if the colchicine and allopurinol improves patient's right heel pain.  #4: CBC with differential, CMP with GFR, uric acid today.  #5: Return to clinic in 3 months. At that time, we will redraw patient's CBC with differential, CMP with GFR, uric acid and compared to today's labs. We will make adjustments of his allopurinol if appropriate. If he has not had any flare of gout since today's visit in April,  I will decrease colchicine to every other day for couple of months and then stop it altogether. Patient is aware to continue allopurinol. He will start allopurinol beginning May 2018. We've discussed this in great detail in the office   Orders: Orders Placed This Encounter  Procedures  . CBC with Differential/Platelet  . COMPLETE METABOLIC PANEL WITH GFR  . Uric acid   Meds ordered this encounter  Medications  . colchicine 0.6 MG tablet    Sig: Take 1 tablet (0.6 mg total) by mouth daily.    Dispense:  30 tablet    Refill:  5    Order Specific Question:   Supervising Provider    Answer:   Bo Merino [[id number]]  . allopurinol (ZYLOPRIM) 100 MG tablet    Sig: Take 1 tablet (100 mg total) by mouth daily.    Dispense:  30 tablet    Refill:  5    Patient will start Allopurinol in Feb 18, 2017 & take everyday. He will continue Colchicine qd.    Order Specific Question:   Supervising Provider    Answer:   Bo Merino [phone]    Face-to-face time spent with patient was 30 minutes. 50% of time was spent in counseling and coordination of care.  Follow-Up Instructions: Return in about 3 months (around 04/30/2017) for rt gr toe pain // prob gout // hx hyperurecemia //rt heel pain.   Eliezer Lofts, PA-C  Patient had mild tenderness on palpation office right first MTP today. I examined and evaluated the patient with Eliezer Lofts PA. The plan of care was discussed as noted above.  Bo Merino, MD  Note - This record has been created using Editor, commissioning.  Chart creation errors have been sought, but may not always  have been located. Such creation errors do not reflect on  the standard of medical care.

## 2017-01-26 DIAGNOSIS — E790 Hyperuricemia without signs of inflammatory arthritis and tophaceous disease: Secondary | ICD-10-CM | POA: Insufficient documentation

## 2017-01-26 DIAGNOSIS — M19071 Primary osteoarthritis, right ankle and foot: Secondary | ICD-10-CM | POA: Insufficient documentation

## 2017-01-26 DIAGNOSIS — Z8639 Personal history of other endocrine, nutritional and metabolic disease: Secondary | ICD-10-CM | POA: Insufficient documentation

## 2017-01-26 DIAGNOSIS — M79671 Pain in right foot: Secondary | ICD-10-CM | POA: Insufficient documentation

## 2017-01-26 DIAGNOSIS — R3129 Other microscopic hematuria: Secondary | ICD-10-CM | POA: Insufficient documentation

## 2017-01-26 DIAGNOSIS — M19072 Primary osteoarthritis, left ankle and foot: Secondary | ICD-10-CM

## 2017-01-29 ENCOUNTER — Encounter: Payer: Self-pay | Admitting: Rheumatology

## 2017-01-29 ENCOUNTER — Ambulatory Visit (INDEPENDENT_AMBULATORY_CARE_PROVIDER_SITE_OTHER): Payer: Commercial Managed Care - HMO | Admitting: Rheumatology

## 2017-01-29 VITALS — BP 134/76 | HR 80 | Resp 16 | Ht 66.0 in | Wt 198.0 lb

## 2017-01-29 DIAGNOSIS — M19071 Primary osteoarthritis, right ankle and foot: Secondary | ICD-10-CM

## 2017-01-29 DIAGNOSIS — I10 Essential (primary) hypertension: Secondary | ICD-10-CM

## 2017-01-29 DIAGNOSIS — M79671 Pain in right foot: Secondary | ICD-10-CM

## 2017-01-29 DIAGNOSIS — R768 Other specified abnormal immunological findings in serum: Secondary | ICD-10-CM | POA: Diagnosis not present

## 2017-01-29 DIAGNOSIS — R3129 Other microscopic hematuria: Secondary | ICD-10-CM

## 2017-01-29 DIAGNOSIS — M19072 Primary osteoarthritis, left ankle and foot: Secondary | ICD-10-CM

## 2017-01-29 DIAGNOSIS — Z8639 Personal history of other endocrine, nutritional and metabolic disease: Secondary | ICD-10-CM

## 2017-01-29 DIAGNOSIS — E790 Hyperuricemia without signs of inflammatory arthritis and tophaceous disease: Secondary | ICD-10-CM | POA: Diagnosis not present

## 2017-01-29 LAB — CBC WITH DIFFERENTIAL/PLATELET
Basophils Absolute: 77 cells/uL (ref 0–200)
Basophils Relative: 1 %
Eosinophils Absolute: 77 cells/uL (ref 15–500)
Eosinophils Relative: 1 %
HEMATOCRIT: 45.2 % (ref 38.5–50.0)
HEMOGLOBIN: 15.1 g/dL (ref 13.2–17.1)
LYMPHS ABS: 3157 {cells}/uL (ref 850–3900)
Lymphocytes Relative: 41 %
MCH: 28.8 pg (ref 27.0–33.0)
MCHC: 33.4 g/dL (ref 32.0–36.0)
MCV: 86.3 fL (ref 80.0–100.0)
MONO ABS: 924 {cells}/uL (ref 200–950)
MPV: 10.1 fL (ref 7.5–12.5)
Monocytes Relative: 12 %
NEUTROS PCT: 45 %
Neutro Abs: 3465 cells/uL (ref 1500–7800)
Platelets: 226 10*3/uL (ref 140–400)
RBC: 5.24 MIL/uL (ref 4.20–5.80)
RDW: 14.5 % (ref 11.0–15.0)
WBC: 7.7 10*3/uL (ref 3.8–10.8)

## 2017-01-29 LAB — COMPLETE METABOLIC PANEL WITH GFR
ALBUMIN: 4.5 g/dL (ref 3.6–5.1)
ALK PHOS: 41 U/L (ref 40–115)
ALT: 34 U/L (ref 9–46)
AST: 26 U/L (ref 10–35)
BUN: 11 mg/dL (ref 7–25)
CALCIUM: 9.9 mg/dL (ref 8.6–10.3)
CO2: 27 mmol/L (ref 20–31)
CREATININE: 1.06 mg/dL (ref 0.70–1.33)
Chloride: 102 mmol/L (ref 98–110)
GFR, Est African American: >89 mL/min (ref >=60)
GFR, Est Non African American: 79 mL/min (ref >=60)
Glucose, Bld: 101 mg/dL — ABNORMAL HIGH (ref 65–99)
Potassium: 4.9 mmol/L (ref 3.5–5.3)
Sodium: 140 mmol/L (ref 135–146)
Total Bilirubin: 0.4 mg/dL (ref 0.2–1.2)
Total Protein: 7 g/dL (ref 6.1–8.1)

## 2017-01-29 LAB — URIC ACID: URIC ACID, SERUM: 6.9 mg/dL (ref 4.0–8.0)

## 2017-01-29 MED ORDER — ALLOPURINOL 100 MG PO TABS: 100.0000 mg | ORAL_TABLET | Freq: Every day | ORAL | 5 refills | Status: DC

## 2017-01-29 MED ORDER — COLCHICINE 0.6 MG PO TABS: 0.6000 mg | ORAL_TABLET | Freq: Every day | ORAL | 5 refills | Status: DC

## 2017-02-05 DIAGNOSIS — E089 Diabetes mellitus due to underlying condition without complications: Secondary | ICD-10-CM | POA: Diagnosis not present

## 2017-02-05 DIAGNOSIS — M1000 Idiopathic gout, unspecified site: Secondary | ICD-10-CM | POA: Diagnosis not present

## 2017-02-05 DIAGNOSIS — I10 Essential (primary) hypertension: Secondary | ICD-10-CM | POA: Diagnosis not present

## 2017-04-24 NOTE — Progress Notes (Deleted)
Office Visit Note  Patient: Alfred Tucker             Date of Birth: 04-29-1961           MRN: [id number]             PCP: Renaye RakersBland, Veita, MD Referring: Renaye RakersBland, Veita, MD Visit Date: 04/30/2017 Occupation: @GUAROCC @    Subjective:  No chief complaint on file.   History of Present Illness: Alfred HeadsCarl W Pickerel is a 56 y.o. male ***   Activities of Daily Living:  Patient reports morning stiffness for *** {minute/hour:19697}.   Patient {ACTIONS;DENIES/REPORTS:21021675::"Denies"} nocturnal pain.  Difficulty dressing/grooming: {ACTIONS;DENIES/REPORTS:21021675::"Denies"} Difficulty climbing stairs: {ACTIONS;DENIES/REPORTS:21021675::"Denies"} Difficulty getting out of chair: {ACTIONS;DENIES/REPORTS:21021675::"Denies"} Difficulty using hands for taps, buttons, cutlery, and/or writing: {ACTIONS;DENIES/REPORTS:21021675::"Denies"}   No Rheumatology ROS completed.   PMFS History:  Patient Active Problem List   Diagnosis Date Noted  . Pain in right foot 01/26/2017  . Primary osteoarthritis of both feet 01/26/2017  . Hyperuricemia 01/26/2017  . History of diabetes mellitus 01/26/2017  . Microscopic hematuria 01/26/2017  . ANA positive 01/24/2017    Past Medical History:  Diagnosis Date  . Arthritis   . Borderline diabetes mellitus    diet controlled  . Hypertension   . Microhematuria   . Phimosis     Family History  Problem Relation Age of Onset  . Diabetes Mother   . Heart disease Mother   . Diabetes Father   . Stroke Sister   . Diabetes Sister   . Stroke Brother   . Diabetes Sister   . Diabetes Brother    Past Surgical History:  Procedure Laterality Date  . CIRCUMCISION N/A 12/15/2012   Procedure: CIRCUMCISION ADULT;  Surgeon: Lindaann SloughMarc-Henry Nesi, MD;  [facility];  Service: Urology;  Laterality: N/A;   Social History   Social History Narrative  . No narrative on file     Objective: Vital Signs: There were no vitals taken for this visit.    Physical Exam   Musculoskeletal Exam: ***  CDAI Exam: No CDAI exam completed.    Investigation: No additional findings.  CBC Latest Ref Rng & Units 01/29/2017 06/03/2016 12/15/2012  WBC 3.8 - 10.8 K/uL 7.7 9.1 -  Hemoglobin 13.2 - 17.1 g/dL 40.915.1 81.114.4 91.415.6  Hematocrit 38.5 - 50.0 % 45.2 44.1 46.0  Platelets 140 - 400 K/uL 226 212 -    CMP Latest Ref Rng & Units 01/29/2017 06/03/2016 12/01/2015  Glucose 65 - 99 mg/dL 782(N101(H) 562(Z101(H) 308(M105(H)  BUN 7 - 25 mg/dL 11 14 12   Creatinine 0.70 - 1.33 mg/dL 5.781.06 4.690.88 6.290.90  Sodium 135 - 146 mmol/L 140 142 138  Potassium 3.5 - 5.3 mmol/L 4.9 4.5 5.0  Chloride 98 - 110 mmol/L 102 106 104  CO2 20 - 31 mmol/L 27 29 28   Calcium 8.6 - 10.3 mg/dL 9.9 9.6 9.8  Total Protein 6.1 - 8.1 g/dL 7.0 - 6.7  Total Bilirubin 0.2 - 1.2 mg/dL 0.4 - 0.6  Alkaline Phos 40 - 115 U/L 41 - 35(L)  AST 10 - 35 U/L 26 - 25  ALT 9 - 46 U/L 34 - 23  uric acid 6.9 Imaging: No results found.  Speciality Comments: No specialty comments available.    Procedures:  No procedures performed Allergies: Novocain [procaine hcl] and Tramadol   Assessment / Plan:     Visit Diagnoses: Primary osteoarthritis of both feet  Hyperuricemia  ANA positive - 1:80 NO (06/04/2016)  Pain in right  foot    Orders: No orders of the defined types were placed in this encounter.  No orders of the defined types were placed in this encounter.   Face-to-face time spent with patient was *** minutes. 50% of time was spent in counseling and coordination of care.  Follow-Up Instructions: No Follow-up on file.   Pollyann Savoy, MD  Note - This record has been created using Animal nutritionist.  Chart creation errors have been sought, but may not always  have been located. Such creation errors do not reflect on  the standard of medical care.

## 2017-04-29 DIAGNOSIS — I10 Essential (primary) hypertension: Secondary | ICD-10-CM | POA: Insufficient documentation

## 2017-04-30 ENCOUNTER — Ambulatory Visit: Payer: Commercial Managed Care - HMO | Admitting: Rheumatology

## 2017-06-01 IMAGING — CR DG WRIST COMPLETE 3+V*L*
4 series · 4 of 4 positions shown · non-contrast
Comparison: None.

CLINICAL DATA: Left wrist pain the and 2 weeks ago, no trauma, the
patient does left weights

EXAM:
LEFT WRIST - COMPLETE 3+ VIEW

[x wrist pa left]
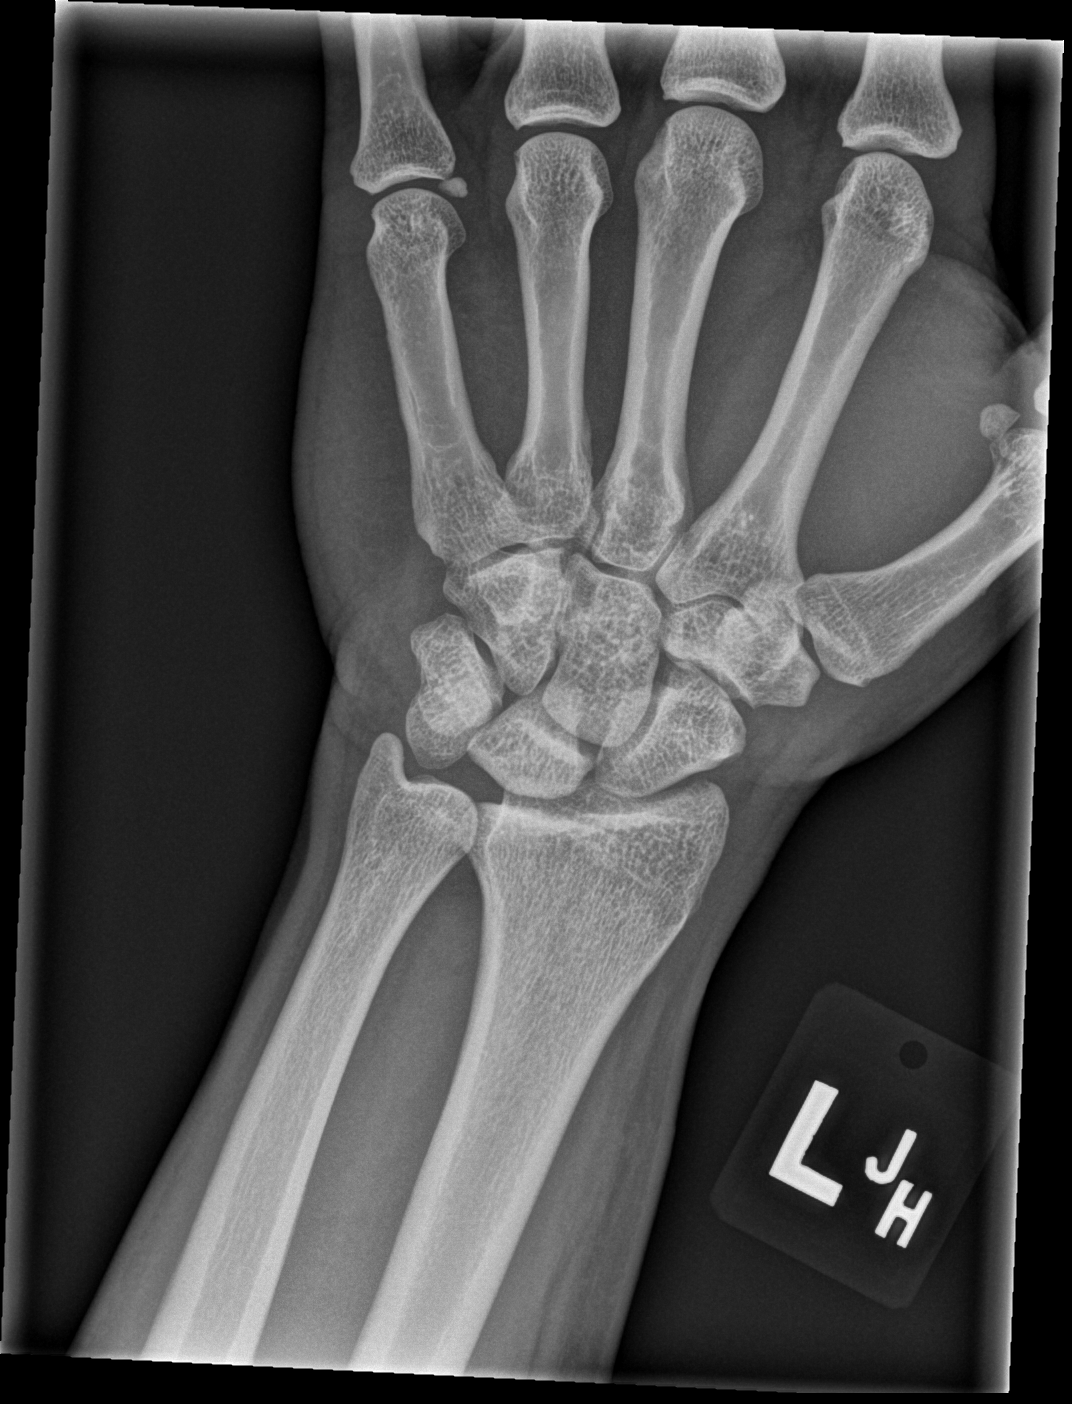

[x wrist navicular view left]
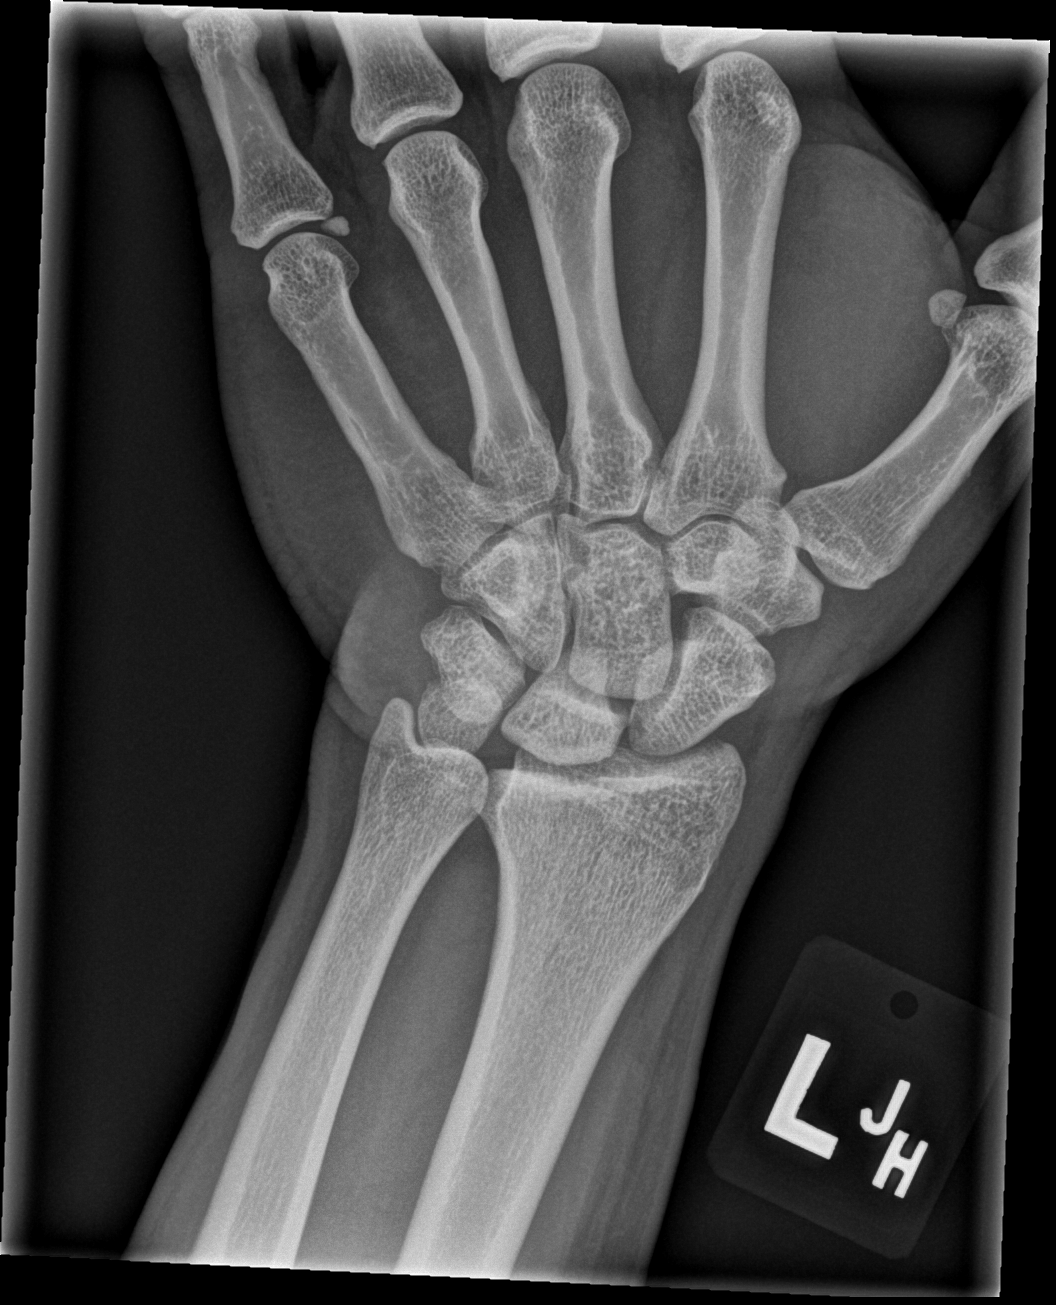

[x wrist obl left]
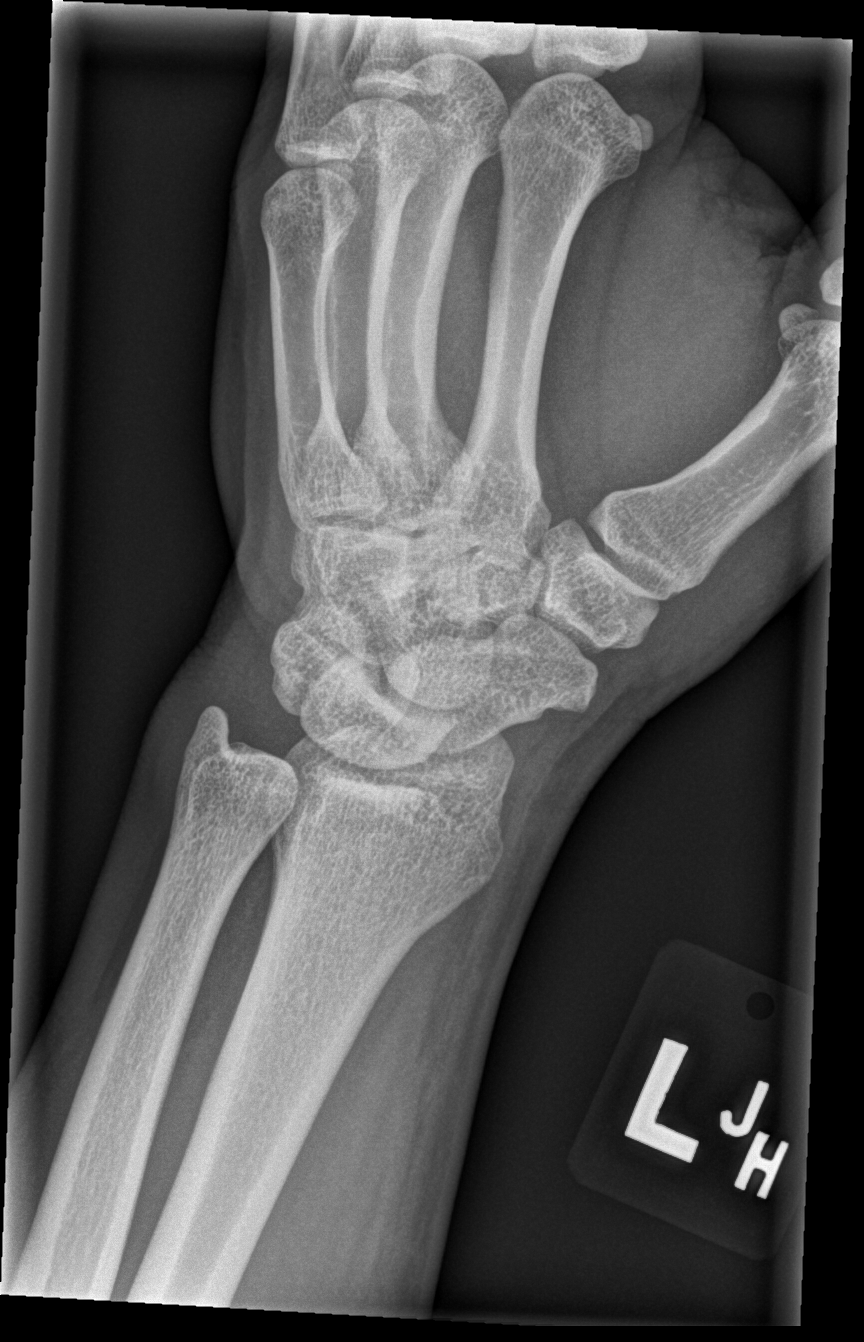

[x wrist lat left]
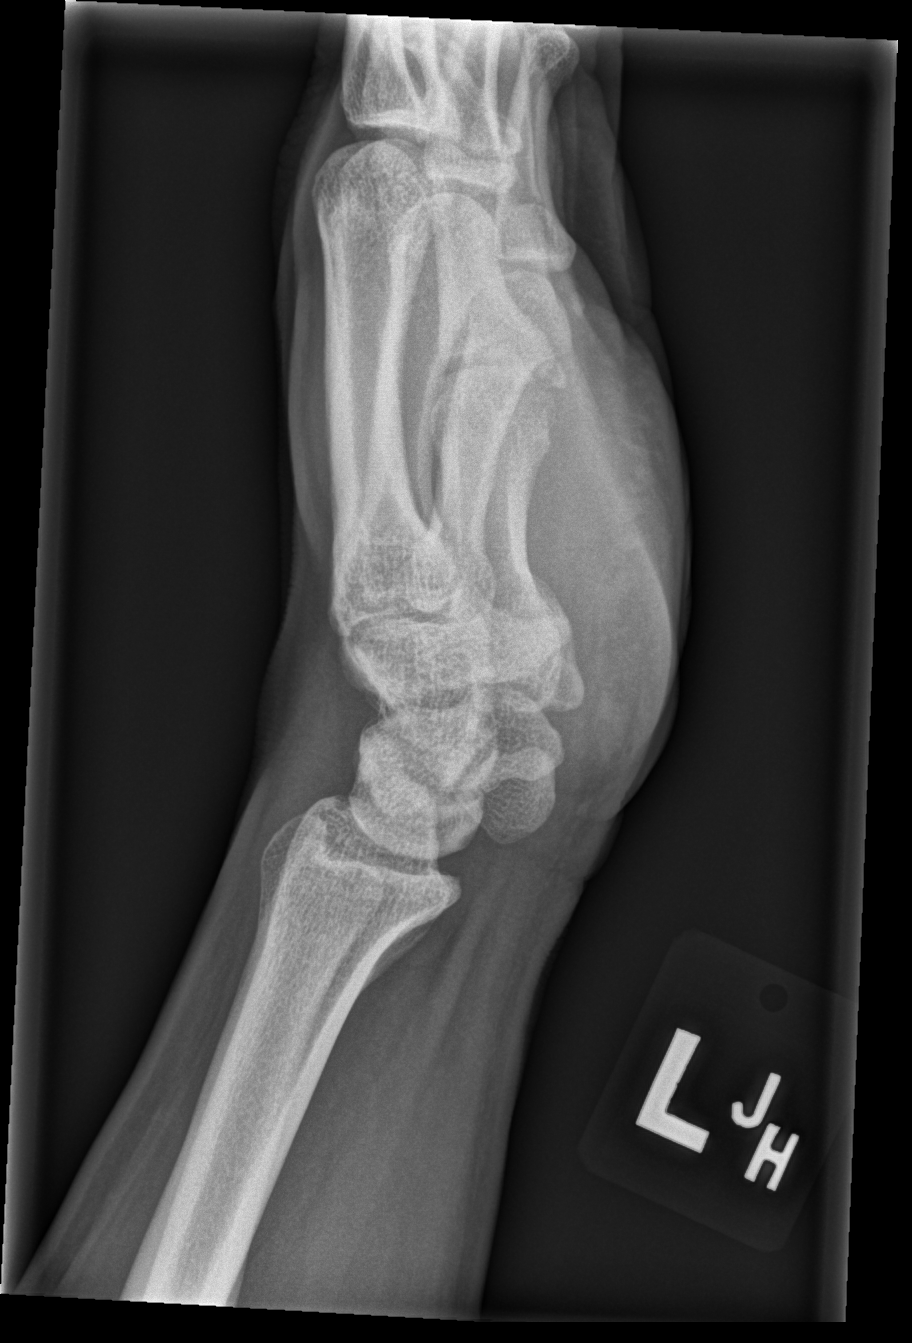

[4 of 4 positions shown; findings below may reference images not displayed]

FINDINGS: The radiocarpal joint space appears normal. The ulnar styloid is
intact. The carpal bones are in normal position and are normally
aligned. No acute abnormality is seen.
IMPRESSION: Negative.

## 2017-06-04 DIAGNOSIS — J399 Disease of upper respiratory tract, unspecified: Secondary | ICD-10-CM | POA: Diagnosis not present

## 2017-06-04 DIAGNOSIS — I10 Essential (primary) hypertension: Secondary | ICD-10-CM | POA: Diagnosis not present

## 2017-06-04 DIAGNOSIS — E0800 Diabetes mellitus due to underlying condition with hyperosmolarity without nonketotic hyperglycemic-hyperosmolar coma (NKHHC): Secondary | ICD-10-CM | POA: Diagnosis not present

## 2017-09-04 DIAGNOSIS — H04123 Dry eye syndrome of bilateral lacrimal glands: Secondary | ICD-10-CM | POA: Diagnosis not present

## 2017-09-04 DIAGNOSIS — H40033 Anatomical narrow angle, bilateral: Secondary | ICD-10-CM | POA: Diagnosis not present

## 2017-10-06 DIAGNOSIS — M1000 Idiopathic gout, unspecified site: Secondary | ICD-10-CM | POA: Diagnosis not present

## 2017-10-06 DIAGNOSIS — E0800 Diabetes mellitus due to underlying condition with hyperosmolarity without nonketotic hyperglycemic-hyperosmolar coma (NKHHC): Secondary | ICD-10-CM | POA: Diagnosis not present

## 2017-10-06 DIAGNOSIS — I10 Essential (primary) hypertension: Secondary | ICD-10-CM | POA: Diagnosis not present

## 2017-10-30 NOTE — Progress Notes (Signed)
Office Visit Note  Patient: Alfred Tucker             Date of Birth: 03-Sep-1961           MRN: [id number]             PCP: Renaye RakersBland, Veita, MD Referring: Renaye RakersBland, Veita, MD Visit Date: 11/12/2017 Occupation: @GUAROCC @    Subjective:  Right great toe pain  History of Present Illness: Alfred Tucker is a 57 y.o. male  With history of hyperuricemia and osteoarthritis.  Patient states Alfred Tucker continues to have pain and swelling in his right first MTP.  Alfred Tucker states Alfred Tucker has not been taking his Allopurinol or colchicine for a long time.  Alfred Tucker states Alfred Tucker has been trying to avoid trigger foods and beer.  Alfred Tucker states that several of his toes on his right toes are developing into hammertoes.  Alfred Tucker has tried metatarsal pads in the past but they got worn down so Alfred Tucker stopped wearing them.  Alfred Tucker needs refills of his medications and metatarsal pads.   Activities of Daily Living:  Patient reports morning stiffness for 0 minutes.   Patient Denies nocturnal pain.  Difficulty dressing/grooming: Denies Difficulty climbing stairs: Denies Difficulty getting out of chair: Reports Difficulty using hands for taps, buttons, cutlery, and/or writing: Denies   Review of Systems  Constitutional: Negative for fatigue, night sweats and weakness.  HENT: Negative for mouth sores, mouth dryness and nose dryness.   Eyes: Positive for dryness. Negative for redness and visual disturbance.  Respiratory: Negative for cough, hemoptysis, shortness of breath and difficulty breathing.   Cardiovascular: Positive for hypertension. Negative for chest pain, palpitations, irregular heartbeat and swelling in legs/feet.  Gastrointestinal: Negative for blood in stool, constipation and diarrhea.  Endocrine: Negative for increased urination.  Genitourinary: Negative for painful urination.  Musculoskeletal: Positive for arthralgias, joint pain and joint swelling. Negative for myalgias, muscle weakness, morning stiffness, muscle tenderness and myalgias.    Skin: Negative for color change, pallor, rash, hair loss, nodules/bumps, redness, skin tightness, ulcers and sensitivity to sunlight.  Allergic/Immunologic: Negative for susceptible to infections.  Neurological: Negative for dizziness, fainting, memory loss and night sweats.  Hematological: Negative for swollen glands.  Psychiatric/Behavioral: Negative for depressed mood and sleep disturbance. The patient is not nervous/anxious.     PMFS History:  Patient Active Problem List   Diagnosis Date Noted  . Essential hypertension 04/29/2017  . Pain in right foot 01/26/2017  . Primary osteoarthritis of both feet 01/26/2017  . Hyperuricemia 01/26/2017  . History of diabetes mellitus 01/26/2017  . Microscopic hematuria 01/26/2017  . ANA positive 01/24/2017    Past Medical History:  Diagnosis Date  . Arthritis   . Borderline diabetes mellitus    diet controlled  . Hypertension   . Microhematuria   . Phimosis     Family History  Problem Relation Age of Onset  . Diabetes Mother   . Heart disease Mother   . Diabetes Father   . Stroke Sister   . Diabetes Sister   . Stroke Brother   . Diabetes Sister   . Diabetes Brother    Past Surgical History:  Procedure Laterality Date  . CIRCUMCISION N/A 12/15/2012   Procedure: CIRCUMCISION ADULT;  Surgeon: Lindaann SloughMarc-Henry Nesi, MD;  [facility];  Service: Urology;  Laterality: N/A;   Social History   Social History Narrative  . Not on file     Objective: Vital Signs: BP (!) 142/84 (BP Location: Left Arm,  Patient Position: Sitting, Cuff Size: Large)   Pulse 71   Resp 14   Ht 5\' 6"  (1.676 m)   Wt 203 lb (92.1 kg)   BMI 32.77 kg/m    Physical Exam  Constitutional: Alfred Tucker is oriented to person, place, and time. Alfred Tucker appears well-developed and well-nourished.  HENT:  Head: Normocephalic and atraumatic.  Eyes: Conjunctivae and EOM are normal. Pupils are equal, round, and reactive to light.  Neck: Normal range of motion.  Neck supple.  Cardiovascular: Normal rate, regular rhythm and normal heart sounds.  Pulmonary/Chest: Effort normal and breath sounds normal.  Abdominal: Soft. Bowel sounds are normal.  Neurological: Alfred Tucker is alert and oriented to person, place, and time.  Skin: Skin is warm and dry. Capillary refill takes less than 2 seconds.  Psychiatric: Alfred Tucker has a normal mood and affect. His behavior is normal.  Nursing note and vitals reviewed.    Musculoskeletal Exam: C-spine, thoracic, and lumbar spine good ROM.  Shoulder joints, elbow joints, wrist joints, MCPs, PIPs, and DIPs good ROM with no synovitis.  Hip joints, knee joints, ankle joints, MTPs, PIPs, and DIPs good ROM with no synovitis.  No midline spinal tenderness.  No SI joint tenderness.  No trochanteric bursitis.  Alfred Tucker has tenderness of his right first MTP with mild inflammation and erythema.  Alfred Tucker has some limitation of motion of his right first PIP joint.  Hammertoes of right 3rd and 4th toes.  No effusion or warmth of knees.     CDAI Exam: No CDAI exam completed.    Investigation: No additional findings. CBC Latest Ref Rng & Units 01/29/2017 06/03/2016 12/15/2012  WBC 3.8 - 10.8 K/uL 7.7 9.1 -  Hemoglobin 13.2 - 17.1 g/dL 16.1 09.6 04.5  Hematocrit 38.5 - 50.0 % 45.2 44.1 46.0  Platelets 140 - 400 K/uL 226 212 -   CMP Latest Ref Rng & Units 01/29/2017 06/03/2016 12/01/2015  Glucose 65 - 99 mg/dL 409(W) 119(J) 478(G)  BUN 7 - 25 mg/dL 11 14 12   Creatinine 0.70 - 1.33 mg/dL 9.56 2.13 0.86  Sodium 135 - 146 mmol/L 140 142 138  Potassium 3.5 - 5.3 mmol/L 4.9 4.5 5.0  Chloride 98 - 110 mmol/L 102 106 104  CO2 20 - 31 mmol/L 27 29 28   Calcium 8.6 - 10.3 mg/dL 9.9 9.6 9.8  Total Protein 6.1 - 8.1 g/dL 7.0 - 6.7  Total Bilirubin 0.2 - 1.2 mg/dL 0.4 - 0.6  Alkaline Phos 40 - 115 U/L 41 - 35(L)  AST 10 - 35 U/L 26 - 25  ALT 9 - 46 U/L 34 - 23    Imaging: No results found.  Speciality Comments: No specialty comments available.    Procedures:    No procedures performed Allergies: Novocain [procaine hcl] and Tramadol   Assessment / Plan:     Visit Diagnoses: Primary osteoarthritis of both feet: Alfred Tucker has discomfort in bilateral feet, worse in the right foot.  Alfred Tucker states his feet swell after standing or driving for long periods of time.  Alfred Tucker was given a script to take to [facility] to get metatarsal pads.     Hallux rigidus, right foot: Alfred Tucker has limited ROM of right first PIP joint.    ANA positive - 1:80 nucleolar 06/03/2016  Hyperuricemia - Alfred Tucker has been having frequent discomfort in his right first MTP joint.  Alfred Tucker has been avoiding trigger foods and beer.  Alfred Tucker has been off his Allopurinol and colchicine due to running out of refills.  Refills  were sent to the pharmacy.  Alfred Tucker will start taking Colchicine 0.6 mg daily.  After 1 week of being on Colchicine, Alfred Tucker will start Allopurinol 1/2 (150 mg) tablet for 1 month and increase to 1 tablet (300 mg) for 1 month.  We will recheck labs in 2 months.  Alfred Tucker will follow up in 4 months. Alfred Tucker voiced understanding of this plan. We checked a uric acid today.  Plan: Uric acid  Medication monitoring encounter - CBC and CMP were drawn today. Plan: CBC with Differential/Platelet, COMPLETE METABOLIC PANEL WITH GFR   History of diabetes mellitus: Alfred Tucker has not been taking his Metformin.    History of hypertension  Orders: Orders Placed This Encounter  Procedures  . CBC with Differential/Platelet  . COMPLETE METABOLIC PANEL WITH GFR  . Uric acid  . CBC with Differential/Platelet  . COMPLETE METABOLIC PANEL WITH GFR  . Uric acid   Meds ordered this encounter  Medications  . colchicine 0.6 MG tablet    Sig: Take 1 tablet (0.6 mg total) by mouth daily.    Dispense:  30 tablet    Refill:  3  . allopurinol (ZYLOPRIM) 300 MG tablet    Sig: Take 1/2 tablet daily for 1 month then increase to 1 tablet daily for 1 month.  Recheck labs in 2 months.    Dispense:  30 tablet    Refill:  3      Follow-Up Instructions:  Return in about 4 months (around 03/12/2018) for Osteoarthritis, Gout.   Pollyann Savoy, MD  Note - This record has been created using Animal nutritionist.  Chart creation errors have been sought, but may not always  have been located. Such creation errors do not reflect on  the standard of medical care.

## 2017-11-12 ENCOUNTER — Ambulatory Visit: Payer: Commercial Managed Care - HMO | Admitting: Rheumatology

## 2017-11-12 ENCOUNTER — Encounter: Payer: Self-pay | Admitting: Rheumatology

## 2017-11-12 VITALS — BP 142/84 | HR 71 | Resp 14 | Ht 66.0 in | Wt 203.0 lb

## 2017-11-12 DIAGNOSIS — E790 Hyperuricemia without signs of inflammatory arthritis and tophaceous disease: Secondary | ICD-10-CM

## 2017-11-12 DIAGNOSIS — M2021 Hallux rigidus, right foot: Secondary | ICD-10-CM | POA: Diagnosis not present

## 2017-11-12 DIAGNOSIS — R768 Other specified abnormal immunological findings in serum: Secondary | ICD-10-CM | POA: Diagnosis not present

## 2017-11-12 DIAGNOSIS — M19071 Primary osteoarthritis, right ankle and foot: Secondary | ICD-10-CM

## 2017-11-12 DIAGNOSIS — Z8639 Personal history of other endocrine, nutritional and metabolic disease: Secondary | ICD-10-CM

## 2017-11-12 DIAGNOSIS — Z5181 Encounter for therapeutic drug level monitoring: Secondary | ICD-10-CM

## 2017-11-12 DIAGNOSIS — M19072 Primary osteoarthritis, left ankle and foot: Secondary | ICD-10-CM | POA: Diagnosis not present

## 2017-11-12 DIAGNOSIS — Z8679 Personal history of other diseases of the circulatory system: Secondary | ICD-10-CM

## 2017-11-12 MED ORDER — ALLOPURINOL 300 MG PO TABS
ORAL_TABLET | ORAL | 3 refills | Status: DC
Start: 2017-11-12 — End: 2018-04-22

## 2017-11-12 MED ORDER — COLCHICINE 0.6 MG PO TABS: 0.6000 mg | ORAL_TABLET | Freq: Every day | ORAL | 3 refills | Status: DC

## 2017-11-12 NOTE — Patient Instructions (Signed)
Standing Labs We placed an order today for your standing lab work.    Please come back and get your standing labs in March   Uric acid, CMP, and CBC  We have open lab Monday through Friday from 8:30-11:30 AM and 1:30-4 PM at the office of Dr. Pollyann SavoyShaili Deveshwar.   The office is located at [address], [address], [place], [place] [address] No appointment is necessary.   Labs are drawn by [facility].  You may receive a bill from [facility] for your lab work. If you have any questions regarding directions or hours of operation,  please call [phone].

## 2017-11-13 LAB — CBC WITH DIFFERENTIAL/PLATELET
BASOS PCT: 0.6 %
Basophils Absolute: 39 cells/uL (ref 0–200)
EOS PCT: 1.7 %
Eosinophils Absolute: 111 cells/uL (ref 15–500)
HEMATOCRIT: 42.3 % (ref 38.5–50.0)
HEMOGLOBIN: 14.3 g/dL (ref 13.2–17.1)
LYMPHS ABS: 2880 {cells}/uL (ref 850–3900)
MCH: 28.5 pg (ref 27.0–33.0)
MCHC: 33.8 g/dL (ref 32.0–36.0)
MCV: 84.3 fL (ref 80.0–100.0)
MPV: 10.5 fL (ref 7.5–12.5)
Monocytes Relative: 12.8 %
NEUTROS ABS: 2639 {cells}/uL (ref 1500–7800)
Neutrophils Relative %: 40.6 %
Platelets: 202 10*3/uL (ref 140–400)
RBC: 5.02 10*6/uL (ref 4.20–5.80)
RDW: 13.2 % (ref 11.0–15.0)
Total Lymphocyte: 44.3 %
WBC mixed population: 832 cells/uL (ref 200–950)
WBC: 6.5 10*3/uL (ref 3.8–10.8)

## 2017-11-13 LAB — COMPLETE METABOLIC PANEL WITH GFR
AG Ratio: 1.8 (calc) (ref 1.0–2.5)
ALKALINE PHOSPHATASE (APISO): 47 U/L (ref 40–115)
ALT: 26 U/L (ref 9–46)
AST: 21 U/L (ref 10–35)
Albumin: 4.4 g/dL (ref 3.6–5.1)
BUN: 10 mg/dL (ref 7–25)
CHLORIDE: 105 mmol/L (ref 98–110)
CO2: 30 mmol/L (ref 20–32)
CREATININE: 0.83 mg/dL (ref 0.70–1.33)
Calcium: 9.7 mg/dL (ref 8.6–10.3)
GFR, Est African American: 114 mL/min/{1.73_m2} (ref >=60)
GFR, Est Non African American: 98 mL/min/{1.73_m2} (ref >=60)
GLUCOSE: 98 mg/dL (ref 65–99)
Globulin: 2.4 g/dL (calc) (ref 1.9–3.7)
Potassium: 4.5 mmol/L (ref 3.5–5.3)
Sodium: 140 mmol/L (ref 135–146)
TOTAL PROTEIN: 6.8 g/dL (ref 6.1–8.1)
Total Bilirubin: 0.4 mg/dL (ref 0.2–1.2)

## 2017-11-13 LAB — URIC ACID: URIC ACID, SERUM: 6.7 mg/dL (ref 4.0–8.0)

## 2017-11-13 NOTE — Progress Notes (Signed)
All labs are WNL

## 2018-02-19 NOTE — Progress Notes (Signed)
Office Visit Note  Patient: Alfred Tucker             Date of Birth: 01/31/1961           MRN: [id number]             PCP: Renaye Rakers, MD Referring: Renaye Rakers, MD Visit Date: 03/04/2018 Occupation: @    Subjective:  Medication Management   History of Present Illness: Alfred Tucker is a 57 y.o. male history of osteoarthritis and gout.  He states he had a gout flare 3 weeks ago.  He states he was on vacation and had some red meat after that he started having pain and discomfort in his right toe.  He took colchicine and aborted the flare.  He has had no other flares since then.  He is not having any discomfort currently.  Although he feels some stiffness in his toe.  He has some numbness in the tip of his right first toe.  Activities of Daily Living:  Patient reports morning stiffness for 0 minute.   Patient Denies nocturnal pain.  Difficulty dressing/grooming: Denies Difficulty climbing stairs: Denies Difficulty getting out of chair: Denies Difficulty using hands for taps, buttons, cutlery, and/or writing: Denies   Review of Systems  Constitutional: Negative for fatigue and night sweats.  HENT: Negative for mouth sores, mouth dryness and nose dryness.   Eyes: Negative for redness and dryness.  Respiratory: Negative for shortness of breath and difficulty breathing.   Cardiovascular: Negative for chest pain, palpitations, hypertension, irregular heartbeat and swelling in legs/feet.  Gastrointestinal: Negative for constipation and diarrhea.  Endocrine: Negative for increased urination.  Musculoskeletal: Positive for arthralgias and joint pain. Negative for joint swelling, myalgias, muscle weakness, morning stiffness, muscle tenderness and myalgias.  Skin: Negative for color change, rash, hair loss, nodules/bumps, skin tightness, ulcers and sensitivity to sunlight.  Allergic/Immunologic: Negative for susceptible to infections.  Neurological: Negative for dizziness,  fainting, memory loss, night sweats and weakness ( ).  Hematological: Negative for swollen glands.  Psychiatric/Behavioral: Negative for depressed mood and sleep disturbance. The patient is not nervous/anxious.     PMFS History:  Patient Active Problem List   Diagnosis Date Noted  . Class 1 obesity due to excess calories without serious comorbidity with body mass index (BMI) of 31.0 to 31.9 in adult 03/04/2018  . Idiopathic chronic gout of multiple sites without tophus 03/04/2018  . Essential hypertension 04/29/2017  . Pain in right foot 01/26/2017  . Primary osteoarthritis of both feet 01/26/2017  . Hyperuricemia 01/26/2017  . History of diabetes mellitus 01/26/2017  . Microscopic hematuria 01/26/2017  . ANA positive 01/24/2017    Past Medical History:  Diagnosis Date  . Arthritis   . Borderline diabetes mellitus    diet controlled  . Hypertension   . Microhematuria   . Phimosis     Family History  Problem Relation Age of Onset  . Diabetes Mother   . Heart disease Mother   . Diabetes Father   . Stroke Sister   . Diabetes Sister   . Stroke Brother   . Diabetes Brother   . Diabetes Sister   . Diabetes Brother    Past Surgical History:  Procedure Laterality Date  . CIRCUMCISION N/A 12/15/2012   Procedure: CIRCUMCISION ADULT;  Surgeon: Lindaann Slough, MD;  Location: [facility];  Service: Urology;  Laterality: N/A;   Social History   Social History Narrative  . Not on file  Objective: Vital Signs: BP 122/79 (BP Location: Left Arm, Patient Position: Sitting, Cuff Size: Normal)   Pulse 69   Resp 16   Ht  (1.676 m)   Wt 196 lb (88.9 kg)   BMI 31.64 kg/m    Physical Exam  Constitutional: He is oriented to person, place, and time. He appears well-developed and well-nourished.  HENT:  Head: Normocephalic and atraumatic.  Eyes: Pupils are equal, round, and reactive to light. Conjunctivae and EOM are normal.  Neck: Normal range of motion.  Neck supple.  Cardiovascular: Normal rate, regular rhythm and normal heart sounds.  Pulmonary/Chest: Effort normal and breath sounds normal.  Abdominal: Soft. Bowel sounds are normal.  Neurological: He is alert and oriented to person, place, and time.  Skin: Skin is warm and dry. Capillary refill takes less than 2 seconds.  Psychiatric: He has a normal mood and affect. His behavior is normal.  Nursing note and vitals reviewed.    Musculoskeletal Exam: C-spine thoracic lumbar spine good range of motion.  Shoulder joints elbow joints wrist joint MCPs PIPs DIPs with good range of motion.  He has some discomfort range of motion of his left shoulder.  Hip joints knee joints ankles MTPs PIPs were in good range of motion.  He has limited range of motion of his right great toe PIP joint.  No swelling or warmth was noted.  CDAI Exam: No CDAI exam completed.    Investigation: No additional findings.Uric acid: 11/12/2017 6.7 CBC Latest Ref Rng & Units 11/12/2017 01/29/2017 06/03/2016  WBC 3.8 - 10.8 Thousand/uL 6.5 7.7 9.1  Hemoglobin 13.2 - 17.1 g/dL 13.0 86.5 78.4  Hematocrit 38.5 - 50.0 % 42.3 45.2 44.1  Platelets 140 - 400 Thousand/uL 202 226 212   CMP Latest Ref Rng & Units 11/12/2017 01/29/2017 06/03/2016  Glucose 65 - 99 mg/dL 98 696(E) 952(W)  BUN 7 - 25 mg/dL Creatinine 0.70 - 1.33 mg/dL 4.13 2.44 0.10  Sodium 135 - 146 mmol/L 140 140 142  Potassium 3.5 - 5.3 mmol/L 4.5 4.9 4.5  Chloride 98 - 110 mmol/L 105 102 106  CO2 20 - 32 mmol/L Calcium 8.6 - 10.3 mg/dL 9.7 9.9 9.6  Total Protein 6.1 - 8.1 g/dL 6.8 7.0 -  Total Bilirubin 0.2 - 1.2 mg/dL 0.4 0.4 -  Alkaline Phos 40 - 115 U/L - 41 -  AST 10 - 35 U/L 21 26 -  ALT 9 - 46 U/L 26 34 -    Imaging: Dg Shoulder Left  Result Date: 02/27/2018 CLINICAL DATA:  Intermittent left shoulder pain x 1.5 months. EXAM: LEFT SHOULDER - 2+ VIEW COMPARISON:  None. FINDINGS: There is no evidence of fracture or dislocation. Minimal  degenerative ridging off the inferior glenoid on the AP projection. Soft tissues are unremarkable. IMPRESSION: Mild degenerative change about the glenohumeral joint. No acute fracture nor joint dislocation. Electronically Signed   By: Tollie Eth M.D.   On: 02/27/2018 03:05   Xr Foot 2 Views Right  Result Date: 03/04/2018 PIP and DIP narrowing was noted.  Mild first MTP narrowing was noted.  No erosive changes were noted.  No intertarsal joint space narrowing was noted.  A small calcaneal spur was noted.  There was no interval change from his previous x-rays. Impression: These findings are consistent with osteoarthritis of the foot.   Speciality Comments: No specialty comments available.    Procedures:  No procedures performed Allergies: Novocain [procaine hcl] and  Tramadol   Assessment / Plan:     Visit Diagnoses: Idiopathic chronic gout of multiple sites without tophus -patient reports having a flare about 3 weeks ago when he had some red meat.  He is having difficulty bending his right great toe PIP joint.  He has mild tenderness but no redness or swelling was noted.  The x-rays show osteoarthritic changes.  I will give him a prescription for Voltaren gel which can be used topically.  Dietary modification was discussed at length.  Plan: Uric acid.  I will adjust his allopurinol dose if his uric acid is elevated.  Pain in right foot - Plan: XR Foot 2 Views Right.  The x-ray showed only osteoarthritic changes and no interval change from the previous x-rays.  Medication management - Plan: CBC with Differential/Platelet, COMPLETE METABOLIC PANEL WITH GFR  Hyperuricemia - allopurinol 300 mg p.o. daily, colchicine 0.6 mg p.o. twice daily as needed.Uric acid: 11/12/2017 6.7  Primary osteoarthritis of both feet  ANA positive - 1:80 nucleolar 06/03/2016.  He has no clinical features of autoimmune disease.  History of hypertension-his blood pressure is mildly elevated today.  History of  diabetes mellitus  Class 1 obesity due to excess calories without serious comorbidity with body mass index (BMI) of 31.0 to 31.9 in adult -association of gout with heart disease was discussed.  Weight loss diet and exercise was discussed.   Orders: Orders Placed This Encounter  Procedures  . XR Foot 2 Views Right  . CBC with Differential/Platelet  . COMPLETE METABOLIC PANEL WITH GFR  . Uric acid   Meds ordered this encounter  Medications  . diclofenac sodium (VOLTAREN) 1 % GEL    Sig: Apply 3 grams to 3 large joints, up to 3 times daily.    Dispense:  3 Tube    Refill:  3    Face-to-face time spent with patient was 30 minutes.>50% of time was spent in counseling and coordination of care.  Follow-Up Instructions: Return in about 6 months (around 09/04/2018) for Osteoarthritis, Gout.   Pollyann Savoy, MD  Note - This record has been created using Animal nutritionist.  Chart creation errors have been sought, but may not always  have been located. Such creation errors do not reflect on  the standard of medical care.

## 2018-02-24 DIAGNOSIS — E1100 Type 2 diabetes mellitus with hyperosmolarity without nonketotic hyperglycemic-hyperosmolar coma (NKHHC): Secondary | ICD-10-CM | POA: Diagnosis not present

## 2018-02-24 DIAGNOSIS — M1000 Idiopathic gout, unspecified site: Secondary | ICD-10-CM | POA: Diagnosis not present

## 2018-02-24 DIAGNOSIS — I10 Essential (primary) hypertension: Secondary | ICD-10-CM | POA: Diagnosis not present

## 2018-02-24 DIAGNOSIS — M7542 Impingement syndrome of left shoulder: Secondary | ICD-10-CM | POA: Diagnosis not present

## 2018-02-26 ENCOUNTER — Other Ambulatory Visit: Payer: Self-pay | Admitting: Family Medicine

## 2018-02-26 ENCOUNTER — Ambulatory Visit
Admission: RE | Admit: 2018-02-26 | Discharge: 2018-02-26 | Disposition: A | Discharge status: Home / Self Care | Payer: 59 | Source: Ambulatory Visit | Attending: Family Medicine | Admitting: Family Medicine

## 2018-02-26 DIAGNOSIS — M25512 Pain in left shoulder: Secondary | ICD-10-CM

## 2018-02-26 DIAGNOSIS — M19012 Primary osteoarthritis, left shoulder: Secondary | ICD-10-CM | POA: Diagnosis not present

## 2018-03-04 ENCOUNTER — Encounter: Payer: Self-pay | Admitting: Rheumatology

## 2018-03-04 ENCOUNTER — Ambulatory Visit (INDEPENDENT_AMBULATORY_CARE_PROVIDER_SITE_OTHER): Payer: Self-pay

## 2018-03-04 ENCOUNTER — Ambulatory Visit: Payer: Commercial Managed Care - HMO | Admitting: Rheumatology

## 2018-03-04 VITALS — BP 122/79 | HR 69 | Resp 16 | Ht 66.0 in | Wt 196.0 lb

## 2018-03-04 DIAGNOSIS — Z6831 Body mass index (BMI) 31.0-31.9, adult: Secondary | ICD-10-CM | POA: Diagnosis not present

## 2018-03-04 DIAGNOSIS — M19072 Primary osteoarthritis, left ankle and foot: Secondary | ICD-10-CM

## 2018-03-04 DIAGNOSIS — M79671 Pain in right foot: Secondary | ICD-10-CM

## 2018-03-04 DIAGNOSIS — E6609 Other obesity due to excess calories: Secondary | ICD-10-CM | POA: Insufficient documentation

## 2018-03-04 DIAGNOSIS — E790 Hyperuricemia without signs of inflammatory arthritis and tophaceous disease: Secondary | ICD-10-CM | POA: Diagnosis not present

## 2018-03-04 DIAGNOSIS — Z8679 Personal history of other diseases of the circulatory system: Secondary | ICD-10-CM | POA: Diagnosis not present

## 2018-03-04 DIAGNOSIS — M1A09X0 Idiopathic chronic gout, multiple sites, without tophus (tophi): Secondary | ICD-10-CM | POA: Diagnosis not present

## 2018-03-04 DIAGNOSIS — M19071 Primary osteoarthritis, right ankle and foot: Secondary | ICD-10-CM | POA: Diagnosis not present

## 2018-03-04 DIAGNOSIS — Z8639 Personal history of other endocrine, nutritional and metabolic disease: Secondary | ICD-10-CM | POA: Diagnosis not present

## 2018-03-04 DIAGNOSIS — Z79899 Other long term (current) drug therapy: Secondary | ICD-10-CM | POA: Diagnosis not present

## 2018-03-04 DIAGNOSIS — R768 Other specified abnormal immunological findings in serum: Secondary | ICD-10-CM | POA: Diagnosis not present

## 2018-03-04 DIAGNOSIS — R7689 Other specified abnormal immunological findings in serum: Secondary | ICD-10-CM

## 2018-03-04 DIAGNOSIS — E66811 Obesity, class 1: Secondary | ICD-10-CM | POA: Insufficient documentation

## 2018-03-04 LAB — COMPLETE METABOLIC PANEL WITH GFR
AG Ratio: 1.9 (calc) (ref 1.0–2.5)
ALT: 36 U/L (ref 9–46)
AST: 34 U/L (ref 10–35)
Albumin: 4.4 g/dL (ref 3.6–5.1)
Alkaline phosphatase (APISO): 50 U/L (ref 40–115)
BILIRUBIN TOTAL: 0.5 mg/dL (ref 0.2–1.2)
BUN: 15 mg/dL (ref 7–25)
CALCIUM: 9.7 mg/dL (ref 8.6–10.3)
CO2: 31 mmol/L (ref 20–32)
CREATININE: 1.06 mg/dL (ref 0.70–1.33)
Chloride: 105 mmol/L (ref 98–110)
GFR, EST AFRICAN AMERICAN: 90 mL/min/{1.73_m2} (ref >=60)
GFR, EST NON AFRICAN AMERICAN: 78 mL/min/{1.73_m2} (ref >=60)
GLUCOSE: 95 mg/dL (ref 65–99)
Globulin: 2.3 g/dL (calc) (ref 1.9–3.7)
Potassium: 4.6 mmol/L (ref 3.5–5.3)
Sodium: 140 mmol/L (ref 135–146)
TOTAL PROTEIN: 6.7 g/dL (ref 6.1–8.1)

## 2018-03-04 LAB — CBC WITH DIFFERENTIAL/PLATELET
BASOS PCT: 0.8 %
Basophils Absolute: 51 cells/uL (ref 0–200)
EOS ABS: 128 {cells}/uL (ref 15–500)
Eosinophils Relative: 2 %
HCT: 40.7 % (ref 38.5–50.0)
HEMOGLOBIN: 13.9 g/dL (ref 13.2–17.1)
Lymphs Abs: 2515 cells/uL (ref 850–3900)
MCH: 28.9 pg (ref 27.0–33.0)
MCHC: 34.2 g/dL (ref 32.0–36.0)
MCV: 84.6 fL (ref 80.0–100.0)
MONOS PCT: 10.5 %
MPV: 11.1 fL (ref 7.5–12.5)
NEUTROS ABS: 3034 {cells}/uL (ref 1500–7800)
Neutrophils Relative %: 47.4 %
Platelets: 206 10*3/uL (ref 140–400)
RBC: 4.81 10*6/uL (ref 4.20–5.80)
RDW: 14 % (ref 11.0–15.0)
Total Lymphocyte: 39.3 %
WBC: 6.4 10*3/uL (ref 3.8–10.8)
WBCMIX: 672 {cells}/uL (ref 200–950)

## 2018-03-04 LAB — URIC ACID: Uric Acid, Serum: 2.3 mg/dL — ABNORMAL LOW (ref 4.0–8.0)

## 2018-03-04 MED ORDER — DICLOFENAC SODIUM 1 % TD GEL: TRANSDERMAL | 3 refills | Status: AC

## 2018-03-05 NOTE — Progress Notes (Signed)
Continue current treatment. 

## 2018-03-18 ENCOUNTER — Ambulatory Visit: Payer: Commercial Managed Care - HMO | Admitting: Rheumatology

## 2018-03-18 DIAGNOSIS — M25512 Pain in left shoulder: Secondary | ICD-10-CM | POA: Diagnosis not present

## 2018-03-19 ENCOUNTER — Telehealth: Payer: Self-pay | Admitting: Rheumatology

## 2018-03-19 MED ORDER — COLCHICINE 0.6 MG PO TABS: 0.6000 mg | ORAL_TABLET | Freq: Every day | ORAL | 3 refills | Status: DC

## 2018-03-19 NOTE — Telephone Encounter (Signed)
Last Visit: 03/04/18 Next Visit: 09/08/18 Labs: 03/04/18 Continue current treatment  Okay to refill per Dr. Corliss Skains

## 2018-03-19 NOTE — Telephone Encounter (Signed)
Patient needs a refill on Colchicine sent to [facility] on [address]/ [facility].

## 2018-03-24 DIAGNOSIS — M25512 Pain in left shoulder: Secondary | ICD-10-CM | POA: Diagnosis not present

## 2018-03-26 DIAGNOSIS — M7542 Impingement syndrome of left shoulder: Secondary | ICD-10-CM | POA: Diagnosis not present

## 2018-04-02 DIAGNOSIS — M25512 Pain in left shoulder: Secondary | ICD-10-CM | POA: Diagnosis not present

## 2018-04-09 DIAGNOSIS — M25512 Pain in left shoulder: Secondary | ICD-10-CM | POA: Diagnosis not present

## 2018-04-22 ENCOUNTER — Other Ambulatory Visit: Payer: Self-pay | Admitting: Physician Assistant

## 2018-04-22 NOTE — Telephone Encounter (Signed)
Last Visit: 03/04/18 Next Visit: 09/08/18 Labs: 03/04/18 cbc/cmp WNL  Okay to refill per Dr. Corliss Skainseveshwar

## 2018-05-26 DIAGNOSIS — E1100 Type 2 diabetes mellitus with hyperosmolarity without nonketotic hyperglycemic-hyperosmolar coma (NKHHC): Secondary | ICD-10-CM | POA: Diagnosis not present

## 2018-05-26 DIAGNOSIS — I10 Essential (primary) hypertension: Secondary | ICD-10-CM | POA: Diagnosis not present

## 2018-08-17 DIAGNOSIS — M7989 Other specified soft tissue disorders: Secondary | ICD-10-CM | POA: Diagnosis not present

## 2018-08-17 DIAGNOSIS — L723 Sebaceous cyst: Secondary | ICD-10-CM | POA: Diagnosis not present

## 2018-08-25 NOTE — Progress Notes (Deleted)
Office Visit Note  Patient: Alfred Tucker             Date of Birth: 1961-02-25           MRN: [id number]             PCP: Renaye Rakers, MD Referring: Renaye Rakers, MD Visit Date: 09/08/2018 Occupation: @GUAROCC @  Subjective:  No chief complaint on file.  Current regimen includes allopurinol 300 mg daily and colchicine 0.6mg  as needed for flares.  Last uric acid 2.3 on 03/04/18.  Most recent CBC/CMP within normal limits on 03/04/18.  Due for CBC/CMP/uric acid today and then every 6 months.   History of Present Illness: Alfred Tucker is a 57 y.o. male with history of osteoarthritis and gout.  Activities of Daily Living:  Patient reports morning stiffness for *** {minute/hour:19697}.   Patient {ACTIONS;DENIES/REPORTS:21021675::"Denies"} nocturnal pain.  Difficulty dressing/grooming: {ACTIONS;DENIES/REPORTS:21021675::"Denies"} Difficulty climbing stairs: {ACTIONS;DENIES/REPORTS:21021675::"Denies"} Difficulty getting out of chair: {ACTIONS;DENIES/REPORTS:21021675::"Denies"} Difficulty using hands for taps, buttons, cutlery, and/or writing: {ACTIONS;DENIES/REPORTS:21021675::"Denies"}  No Rheumatology ROS completed.   PMFS History:  Patient Active Problem List   Diagnosis Date Noted  . Class 1 obesity due to excess calories without serious comorbidity with body mass index (BMI) of 31.0 to 31.9 in adult 03/04/2018  . Idiopathic chronic gout of multiple sites without tophus 03/04/2018  . Essential hypertension 04/29/2017  . Pain in right foot 01/26/2017  . Primary osteoarthritis of both feet 01/26/2017  . Hyperuricemia 01/26/2017  . History of diabetes mellitus 01/26/2017  . Microscopic hematuria 01/26/2017  . ANA positive 01/24/2017    Past Medical History:  Diagnosis Date  . Arthritis   . Borderline diabetes mellitus    diet controlled  . Hypertension   . Microhematuria   . Phimosis     Family History  Problem Relation Age of Onset  . Diabetes Mother   . Heart disease  Mother   . Diabetes Father   . Stroke Sister   . Diabetes Sister   . Stroke Brother   . Diabetes Brother   . Diabetes Sister   . Diabetes Brother    Past Surgical History:  Procedure Laterality Date  . CIRCUMCISION N/A 12/15/2012   Procedure: CIRCUMCISION ADULT;  Surgeon: Lindaann Slough, MD;  Location: [facility];  Service: Urology;  Laterality: N/A;   Social History   Social History Narrative  . Not on file    Objective: Vital Signs: There were no vitals taken for this visit.   Physical Exam   Musculoskeletal Exam: ***  CDAI Exam: CDAI Score: Not documented Patient Global Assessment: Not documented; Provider Global Assessment: Not documented Swollen: Not documented; Tender: Not documented Joint Exam   Not documented   There is currently no information documented on the homunculus. Go to the Rheumatology activity and complete the homunculus joint exam.  Investigation: No additional findings.  Imaging: No results found.  Recent Labs: Lab Results  Component Value Date   WBC 6.4 03/04/2018   HGB 13.9 03/04/2018   PLT 206 03/04/2018   NA 140 03/04/2018   K 4.6 03/04/2018   CL 105 03/04/2018   CO2 31 03/04/2018   GLUCOSE 95 03/04/2018   BUN 15 03/04/2018   CREATININE 1.06 03/04/2018   BILITOT 0.5 03/04/2018   ALKPHOS 41 01/29/2017   AST 34 03/04/2018   ALT 36 03/04/2018   PROT 6.7 03/04/2018   ALBUMIN 4.5 01/29/2017   CALCIUM 9.7 03/04/2018   GFRAA 90 03/04/2018   Lab Results  Component Value Date   LABURIC 2.3 (L) 03/04/2018   Speciality Comments: No specialty comments available.  Procedures:  No procedures performed Allergies: Novocain [procaine hcl] and Tramadol   Assessment / Plan:     Visit Diagnoses: Idiopathic chronic gout of multiple sites without tophus -  allopurinol 300 mg p.o. daily, colchicine 0.6 mg p.o. twice daily as needed.  Hyperuricemia  Medication management  Primary osteoarthritis of both feet  ANA  positive  History of hypertension  History of diabetes mellitus  Hallux rigidus, right foot   Orders: No orders of the defined types were placed in this encounter.  No orders of the defined types were placed in this encounter.   Face-to-face time spent with patient was *** minutes. Greater than 50% of time was spent in counseling and coordination of care.  Follow-Up Instructions: No follow-ups on file.   Gearldine Bienenstock, PA-C  Note - This record has been created using Dragon software.  Chart creation errors have been sought, but may not always  have been located. Such creation errors do not reflect on  the standard of medical care. m

## 2018-08-27 DIAGNOSIS — E1100 Type 2 diabetes mellitus with hyperosmolarity without nonketotic hyperglycemic-hyperosmolar coma (NKHHC): Secondary | ICD-10-CM | POA: Diagnosis not present

## 2018-08-27 DIAGNOSIS — I10 Essential (primary) hypertension: Secondary | ICD-10-CM | POA: Diagnosis not present

## 2018-08-27 DIAGNOSIS — M1000 Idiopathic gout, unspecified site: Secondary | ICD-10-CM | POA: Diagnosis not present

## 2018-08-27 DIAGNOSIS — E089 Diabetes mellitus due to underlying condition without complications: Secondary | ICD-10-CM | POA: Diagnosis not present

## 2018-08-27 DIAGNOSIS — M10071 Idiopathic gout, right ankle and foot: Secondary | ICD-10-CM | POA: Diagnosis not present

## 2018-09-03 DIAGNOSIS — H40033 Anatomical narrow angle, bilateral: Secondary | ICD-10-CM | POA: Diagnosis not present

## 2018-09-03 DIAGNOSIS — H04123 Dry eye syndrome of bilateral lacrimal glands: Secondary | ICD-10-CM | POA: Diagnosis not present

## 2018-09-08 ENCOUNTER — Ambulatory Visit: Payer: 59 | Admitting: Physician Assistant

## 2018-09-29 DIAGNOSIS — L723 Sebaceous cyst: Secondary | ICD-10-CM | POA: Diagnosis not present

## 2018-11-19 DIAGNOSIS — L723 Sebaceous cyst: Secondary | ICD-10-CM | POA: Diagnosis not present

## 2018-11-22 DIAGNOSIS — M26622 Arthralgia of left temporomandibular joint: Secondary | ICD-10-CM | POA: Diagnosis not present

## 2018-11-24 DIAGNOSIS — E1100 Type 2 diabetes mellitus with hyperosmolarity without nonketotic hyperglycemic-hyperosmolar coma (NKHHC): Secondary | ICD-10-CM | POA: Diagnosis not present

## 2018-11-24 DIAGNOSIS — I10 Essential (primary) hypertension: Secondary | ICD-10-CM | POA: Diagnosis not present

## 2018-11-24 DIAGNOSIS — M26602 Left temporomandibular joint disorder, unspecified: Secondary | ICD-10-CM | POA: Diagnosis not present

## 2018-12-29 DIAGNOSIS — I10 Essential (primary) hypertension: Secondary | ICD-10-CM | POA: Diagnosis not present

## 2018-12-29 DIAGNOSIS — E1169 Type 2 diabetes mellitus with other specified complication: Secondary | ICD-10-CM | POA: Diagnosis not present

## 2019-08-09 ENCOUNTER — Other Ambulatory Visit: Payer: Self-pay

## 2019-08-09 DIAGNOSIS — Z20822 Contact with and (suspected) exposure to covid-19: Secondary | ICD-10-CM

## 2019-08-11 LAB — NOVEL CORONAVIRUS, NAA: SARS-CoV-2, NAA: NOT DETECTED

## 2020-04-03 IMAGING — CR DG SHOULDER 2+V*L*
3 series · 3 of 3 positions shown · non-contrast
Comparison: None.

CLINICAL DATA: Intermittent left shoulder pain x 1.5 months.

EXAM:
LEFT SHOULDER - 2+ VIEW

[w shoulder grashey left]
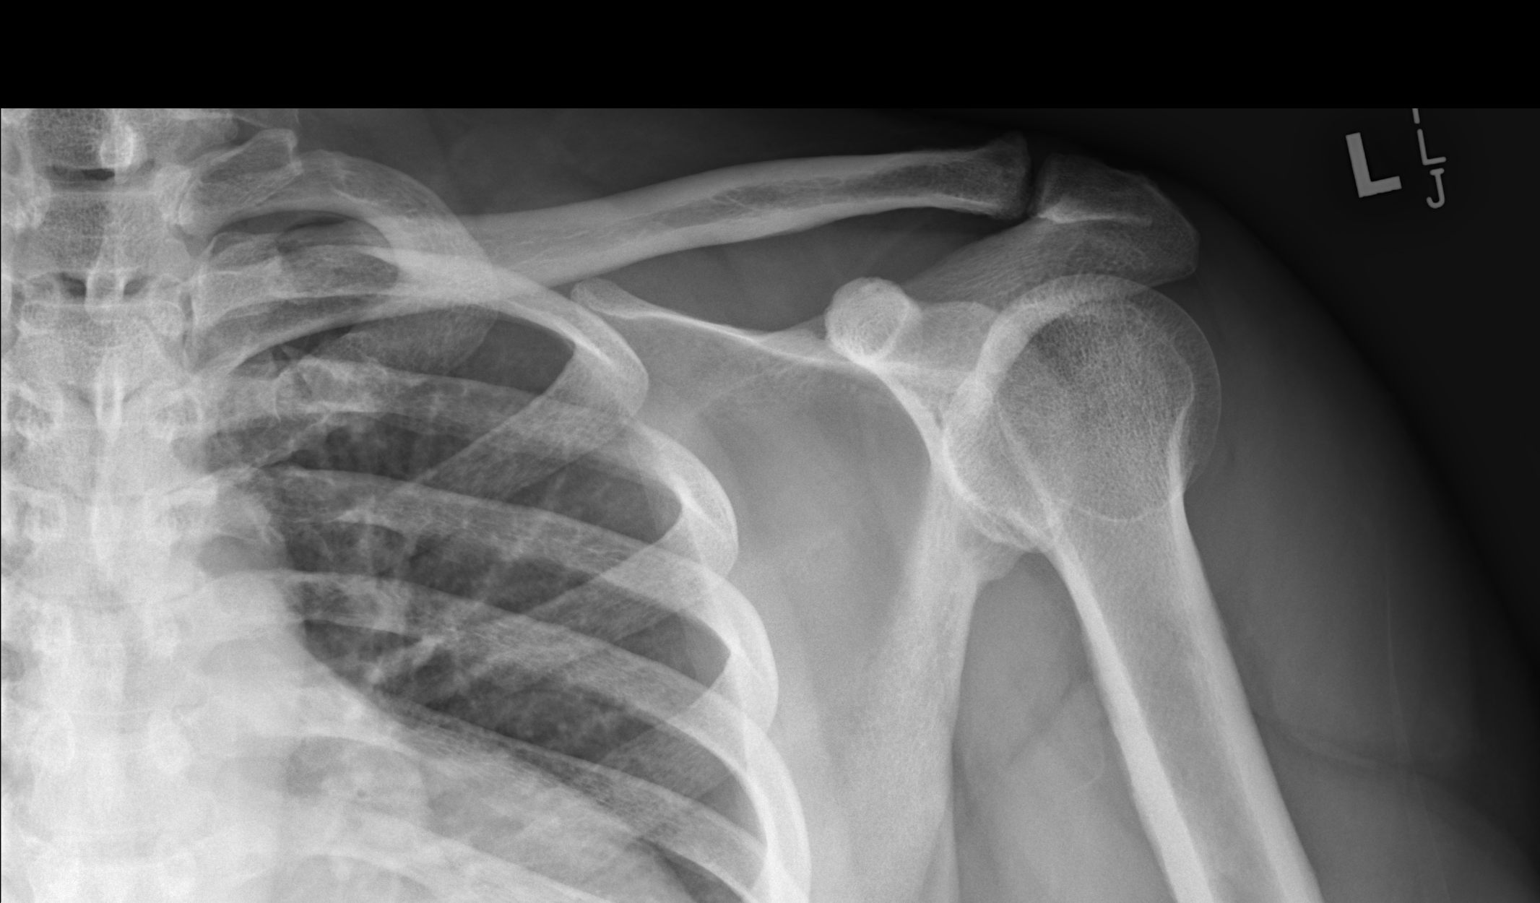

[w shoulder y-view left]
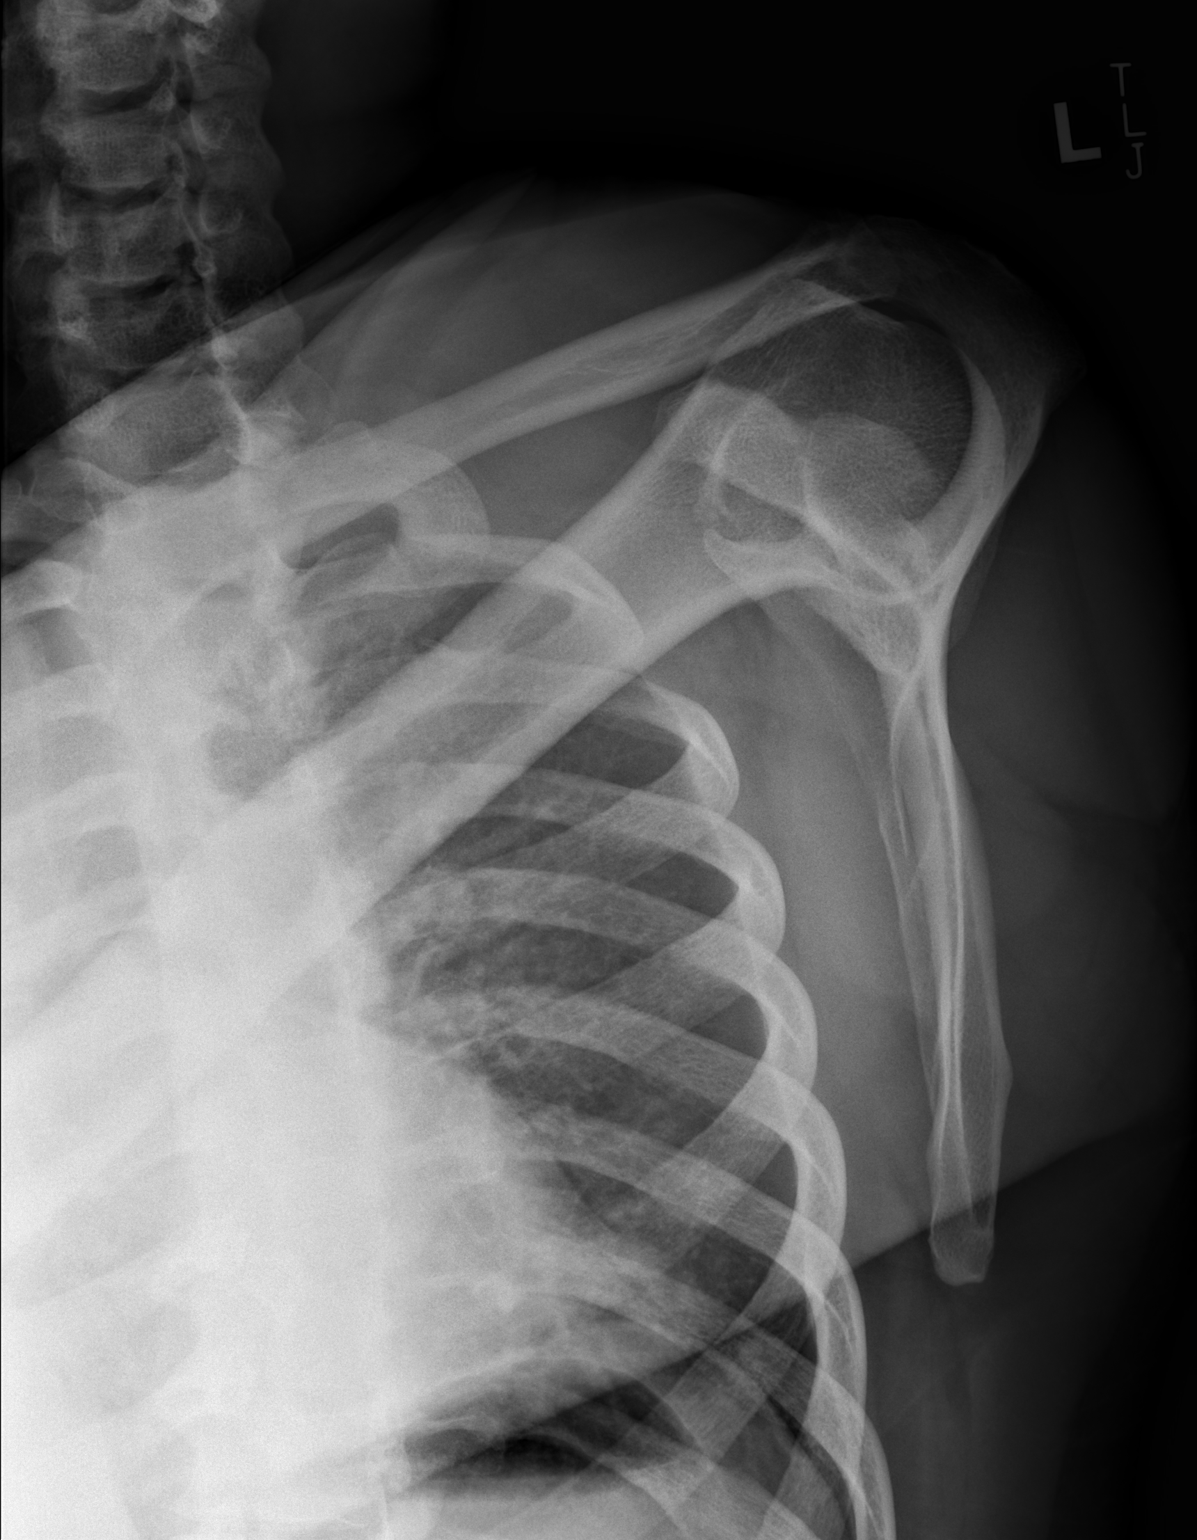

[w shoulder axillary left]
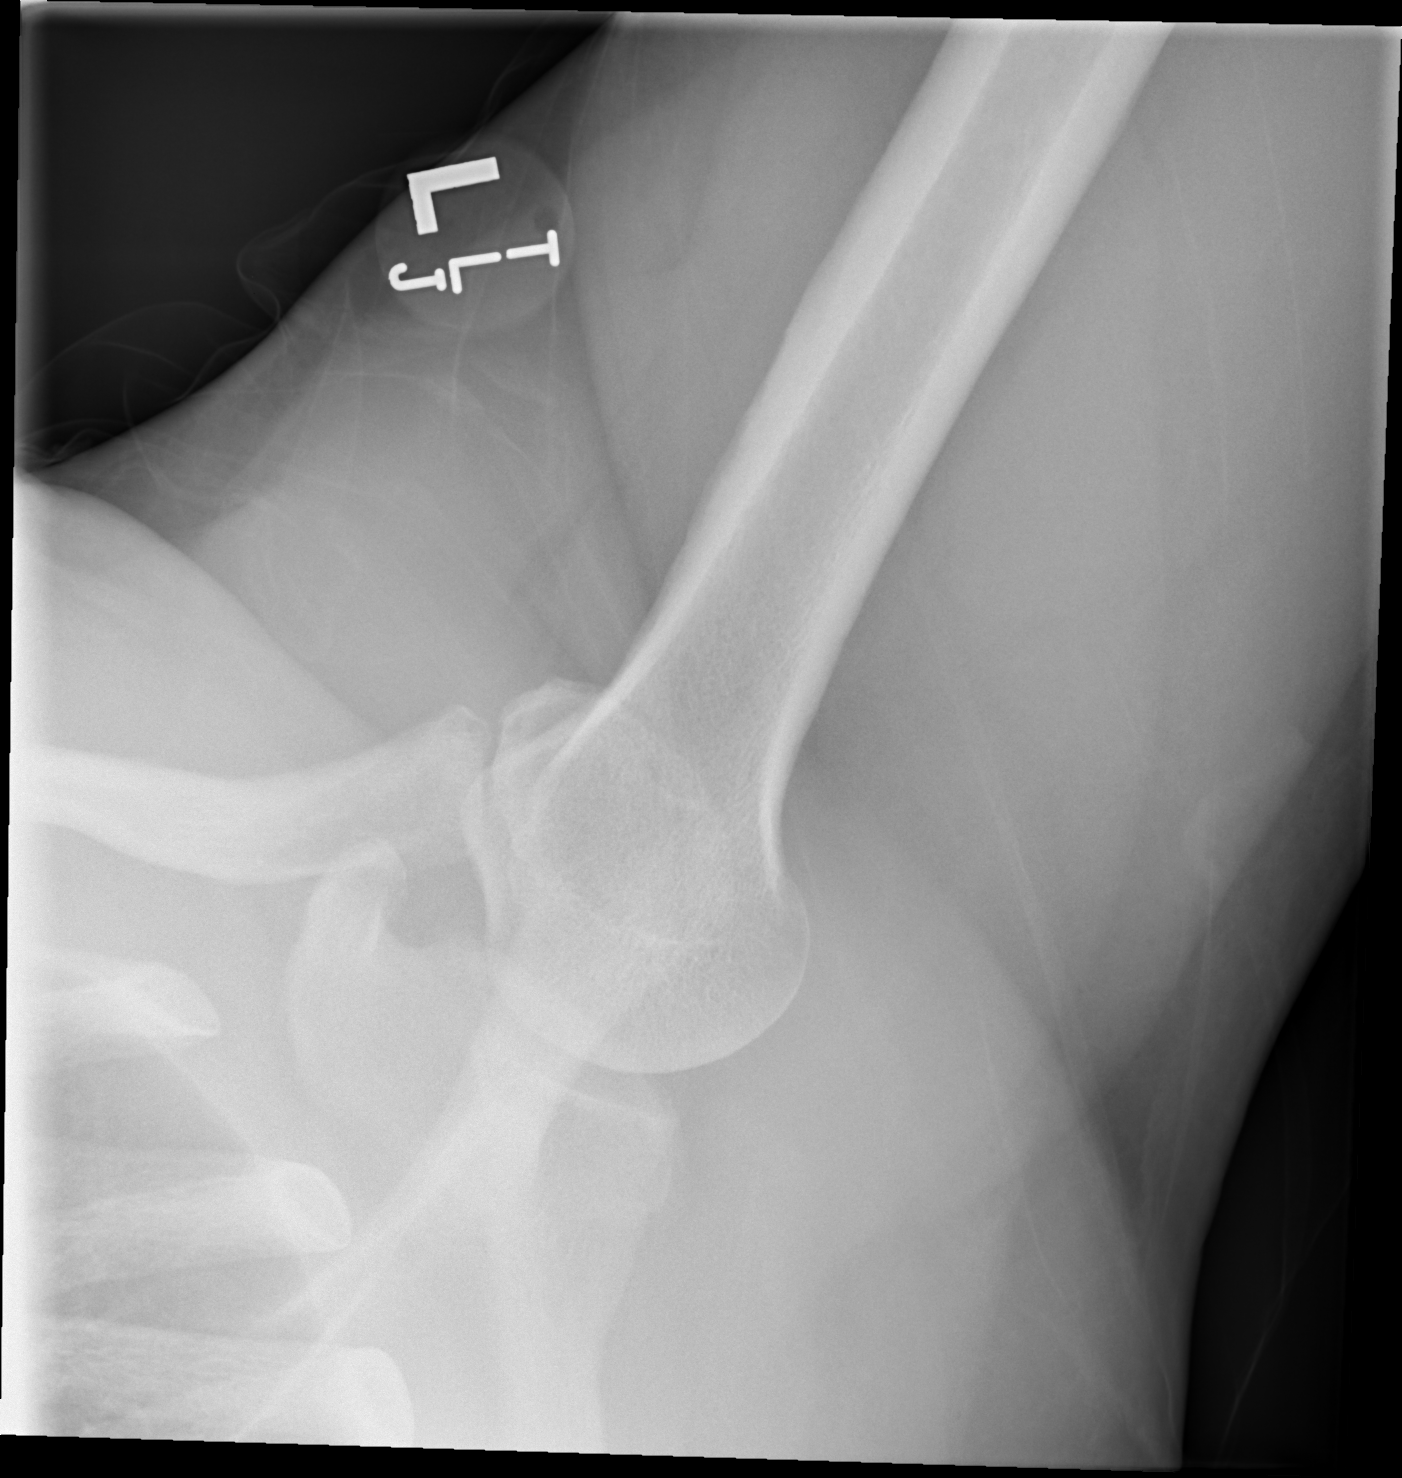

[3 of 3 positions shown; findings below may reference images not displayed]

FINDINGS: There is no evidence of fracture or dislocation. Minimal
degenerative ridging off the inferior glenoid on the AP projection.
Soft tissues are unremarkable.
IMPRESSION: Mild degenerative change about the glenohumeral joint. No acute
fracture nor joint dislocation.

## 2023-04-04 ENCOUNTER — Encounter: Payer: Self-pay | Admitting: Medical

## 2023-04-04 ENCOUNTER — Ambulatory Visit: Payer: 59 | Admitting: Medical

## 2023-04-04 VITALS — BP 130/80 | HR 70 | Temp 98.3°F | Resp 18 | Ht 66.0 in | Wt 180.4 lb

## 2023-04-04 DIAGNOSIS — Z125 Encounter for screening for malignant neoplasm of prostate: Secondary | ICD-10-CM | POA: Diagnosis not present

## 2023-04-04 DIAGNOSIS — N401 Enlarged prostate with lower urinary tract symptoms: Secondary | ICD-10-CM

## 2023-04-04 DIAGNOSIS — R3911 Hesitancy of micturition: Secondary | ICD-10-CM

## 2023-04-04 DIAGNOSIS — E119 Type 2 diabetes mellitus without complications: Secondary | ICD-10-CM | POA: Diagnosis not present

## 2023-04-04 DIAGNOSIS — M7711 Lateral epicondylitis, right elbow: Secondary | ICD-10-CM | POA: Diagnosis not present

## 2023-04-04 DIAGNOSIS — M1000 Idiopathic gout, unspecified site: Secondary | ICD-10-CM | POA: Diagnosis not present

## 2023-04-04 DIAGNOSIS — I10 Essential (primary) hypertension: Secondary | ICD-10-CM

## 2023-04-04 LAB — COMPREHENSIVE METABOLIC PANEL
ALT: 23 U/L (ref 0–53)
AST: 20 U/L (ref 0–37)
Albumin: 4.3 g/dL (ref 3.5–5.2)
Alkaline Phosphatase: 36 U/L — ABNORMAL LOW (ref 39–117)
BUN: 15 mg/dL (ref 6–23)
CO2: 30 mEq/L (ref 19–32)
Calcium: 9.4 mg/dL (ref 8.4–10.5)
Chloride: 102 mEq/L (ref 96–112)
Creatinine, Ser: 0.96 mg/dL (ref 0.40–1.50)
GFR: 85.37 mL/min (ref >60.00)
Glucose, Bld: 132 mg/dL — ABNORMAL HIGH (ref 70–99)
Potassium: 4.1 mEq/L (ref 3.5–5.1)
Sodium: 139 mEq/L (ref 135–145)
Total Bilirubin: 0.7 mg/dL (ref 0.2–1.2)
Total Protein: 6.9 g/dL (ref 6.0–8.3)

## 2023-04-04 LAB — LIPID PANEL
Cholesterol: 175 mg/dL (ref 0–200)
HDL: 47.2 mg/dL (ref >39.00)
LDL Cholesterol: 109 mg/dL — ABNORMAL HIGH (ref 0–99)
NonHDL: 127.56
Total CHOL/HDL Ratio: 4
Triglycerides: 94 mg/dL (ref 0.0–149.0)
VLDL: 18.8 mg/dL (ref 0.0–40.0)

## 2023-04-04 LAB — URIC ACID: Uric Acid, Serum: 7.7 mg/dL (ref 4.0–7.8)

## 2023-04-04 LAB — PSA: PSA: 1.19 ng/mL (ref 0.10–4.00)

## 2023-04-04 LAB — HEMOGLOBIN A1C: Hgb A1c MFr Bld: 7.4 % — ABNORMAL HIGH (ref 4.6–6.5)

## 2023-04-04 MED ORDER — TAMSULOSIN HCL 0.4 MG PO CAPS: 0.4000 mg | ORAL_CAPSULE | Freq: Every day | ORAL | 3 refills | Status: DC

## 2023-04-04 NOTE — Patient Instructions (Addendum)
1.  On past  A1c was controlled type 2 diabetes mellitus without complication, without long-term current use of insulin (HCC) Will follow labs and give recommendation on medications if needed. - Hemoglobin A1c - Lipid panel - Comp Met (CMET)  2. Hypertension, unspecified type Bp controlled on enalapril  3. Idiopathic gout, unspecified chronicity, unspecified site Follow level and may recommend med if high - Uric acid  4. Lateral epicondylitis of right elbow Continue tennis elbow brace, ibuprofen 200 mg every 8 hours and place referral to sports med - Ambulatory referral to Sports Medicine  5. Screening for prostate cancer - PSA  6. Benign prostatic hyperplasia with urinary hesitancy Refilled your flomax. Ask you call you urologist to get scheduled for follow up.  Follow up one month or sooner if needed.

## 2023-04-04 NOTE — Progress Notes (Signed)
Subjective:    Patient ID: Alfred Tucker, male    DOB: 1960/12/01, 62 y.o.   MRN: [id number]  HPI  Pt in for first time.  Pt truck driver. Local tractor trailer. Pt states walks daily. No weights. Pt states eating healthy recently. Coffee- 1 cup a day. Nonsmoker. Occasional wine on weekends. 1-3 glasses at most on weekend.  Htn- pt on enlapril 20 mg daily.  Gout- last flare 8-9 months ago. Pt not on allopurinol or colcchicine.  Elevated sugar in the past. Pt was prescribed metformin in the past. He never started. He states diagnosed with prediabtes. I see note in epic where A1c was 6.7  Pt states he had recent upper forearm and lateral elbow pain. He noticed recently worse at work. Pain has been on and off for 3 months.  Bph type symptoms- pt states has been on tamsulosin in past. He needs refill.  Pt has urologist.  Review of Systems  Constitutional:  Negative for chills, fatigue and fever.  Respiratory:  Negative for cough, chest tightness and wheezing.   Cardiovascular:  Negative for chest pain and palpitations.  Gastrointestinal:  Negative for abdominal pain.  Musculoskeletal:        Rt elbow pain.  Skin:  Negative for rash.  Neurological:  Negative for dizziness, seizures, light-headedness and headaches.  Hematological:  Negative for adenopathy.  Psychiatric/Behavioral:  Negative for behavioral problems and confusion.    Past Medical History:  Diagnosis Date   Arthritis    Borderline diabetes mellitus    diet controlled   Hypertension    Microhematuria    Phimosis      Social History   Socioeconomic History   Marital status: Widowed    Spouse name: Not on file   Number of children: Not on file   Years of education: Not on file   Highest education level: Not on file  Occupational History   Not on file  Tobacco Use   Smoking status: Former    Packs/day: 0.25    Years: 10.00    Additional pack years: 0.00    Total pack years: 2.50    Types: Cigarettes     Quit date: 12/10/1992    Years since quitting: 30.3   Smokeless tobacco: Never  Vaping Use   Vaping Use: Never used  Substance and Sexual Activity   Alcohol use: Yes    Comment: rarely    Drug use: No   Sexual activity: Not on file  Other Topics Concern   Not on file  Social History Narrative   Not on file   Social Determinants of Health   Financial Resource Strain: Not on file  Food Insecurity: Not on file  Transportation Needs: Not on file  Physical Activity: Not on file  Stress: Not on file  Social Connections: Not on file  Intimate Partner Violence: Not on file    Past Surgical History:  Procedure Laterality Date   CIRCUMCISION N/A 12/15/2012   Procedure: CIRCUMCISION ADULT;  Surgeon: Lindaann Slough, MD;  Location: [facility];  Service: Urology;  Laterality: N/A;    Family History  Problem Relation Age of Onset   Diabetes Mother    Heart disease Mother    Diabetes Father    Stroke Sister    Diabetes Sister    Stroke Brother    Diabetes Brother    Diabetes Sister    Diabetes Brother     Allergies  Allergen Reactions   Novocain [Procaine  Hcl] Itching   Tramadol Itching    Current Outpatient Medications on File Prior to Visit  Medication Sig Dispense Refill   enalapril (VASOTEC) 20 MG tablet Take 20 mg by mouth daily.     allopurinol (ZYLOPRIM) 300 MG tablet Take 1 tablet (300 mg total) by mouth daily. 1 tablet by mouth daily. (Patient not taking: Reported on 04/04/2023) 30 tablet 3   colchicine 0.6 MG tablet Take 1 tablet (0.6 mg total) by mouth daily. 30 tablet 3   diclofenac sodium (VOLTAREN) 1 % GEL Apply 3 grams to 3 large joints, up to 3 times daily. (Patient not taking: Reported on 04/04/2023) 3 Tube 3   No current facility-administered medications on file prior to visit.    BP 130/80   Pulse 70   Temp 98.3 F (36.8 C) (Oral)   Resp 18   Ht 5\' 6"  (1.676 m)   Wt 180 lb 6.4 oz (81.8 kg)   SpO2 98%   BMI 29.12 kg/m           Objective:   Physical Exam  General Mental Status- Alert. General Appearance- Not in acute distress.   Skin General: Color- Normal Color. Moisture- Normal Moisture.  Neck Carotid Arteries- Normal color. Moisture- Normal Moisture. No carotid bruits. No JVD.  Chest and Lung Exam Auscultation: Breath Sounds:-Normal.  Cardiovascular Auscultation:Rythm- Regular. Murmurs & Other Heart Sounds:Auscultation of the heart reveals- No Murmurs.  Abdomen Inspection:-Inspeection Normal. Palpation/Percussion:Note:No mass. Palpation and Percussion of the abdomen reveal- Non Tender, Non Distended + BS, no rebound or guarding.   Neurologic Cranial Nerve exam:- CN III-XII intact(No nystagmus), symmetric smile. Strength:- 5/5 equal and symmetric strength both upper and lower extremities.   Rt upper ext- mild pain rt lateral epicondyle. Pain on pronation and supination.    Assessment & Plan:   Patient Instructions  1.  On past  A1c was controlled type 2 diabetes mellitus without complication, without long-term current use of insulin (HCC) Will follow labs and give recommendation on medications if needed. - Hemoglobin A1c - Lipid panel - Comp Met (CMET)  2. Hypertension, unspecified type Bp controlled on enalapril  3. Idiopathic gout, unspecified chronicity, unspecified site Follow level and may recommend med if high - Uric acid  4. Lateral epicondylitis of right elbow Continue tennis elbow brace, ibuprofen 200 mg every 8 hours and place referral to sports med - Ambulatory referral to Sports Medicine  5. Screening for prostate cancer - PSA  6. Benign prostatic hyperplasia with urinary hesitancy Refilled your flomax. Ask you call you urologist to get scheduled for follow up.  Follow up one month or sooner if needd.    Esperanza Richters, PA-C

## 2023-04-05 MED ORDER — DAPAGLIFLOZIN PROPANEDIOL 10 MG PO TABS: 10.0000 mg | ORAL_TABLET | Freq: Every day | ORAL | 11 refills | Status: AC

## 2023-04-05 MED ORDER — ATORVASTATIN CALCIUM 10 MG PO TABS: 10.0000 mg | ORAL_TABLET | Freq: Every day | ORAL | 3 refills | Status: AC

## 2023-04-05 NOTE — Addendum Note (Signed)
Addended by: Gwenevere Abbot on: 04/05/2023 08:27 AM   Modules accepted: Orders

## 2023-04-11 ENCOUNTER — Ambulatory Visit: Payer: 59 | Admitting: Sports Medicine

## 2023-04-11 VITALS — BP 134/86 | Ht 66.0 in | Wt 180.0 lb

## 2023-04-11 DIAGNOSIS — M7711 Lateral epicondylitis, right elbow: Secondary | ICD-10-CM | POA: Diagnosis not present

## 2023-04-11 MED ORDER — PREDNISONE 10 MG PO TABS: ORAL_TABLET | ORAL | 0 refills | Status: DC

## 2023-04-11 NOTE — Progress Notes (Signed)
PCP: Esperanza Richters, PA-C  Subjective:   HPI: Patient is a 61 y.o. male with history of gout here for right elbow pain for the past 2 months.  Noticed itching of the right forearm about 2 months ago which resolved after a few days. Reports forearm was sore with pain of the lateral epicondyle. Pain with lifting and finger movement. He works at the [employer] and lifts and pulls heavy loads. Does not recall a particular injury. No rash with itching. Is using a elbow brace with some improvment, has not tried ibuprofen.   Past Medical History:  Diagnosis Date   Arthritis    Borderline diabetes mellitus    diet controlled   Hypertension    Microhematuria    Phimosis     Current Outpatient Medications on File Prior to Visit  Medication Sig Dispense Refill   allopurinol (ZYLOPRIM) 300 MG tablet Take 1 tablet (300 mg total) by mouth daily. 1 tablet by mouth daily. (Patient not taking: Reported on 04/04/2023) 30 tablet 3   atorvastatin (LIPITOR) 10 MG tablet Take 1 tablet (10 mg total) by mouth daily. 30 tablet 3   colchicine 0.6 MG tablet Take 1 tablet (0.6 mg total) by mouth daily. 30 tablet 3   dapagliflozin propanediol (FARXIGA) 10 MG TABS tablet Take 1 tablet (10 mg total) by mouth daily before breakfast. 30 tablet 11   diclofenac sodium (VOLTAREN) 1 % GEL Apply 3 grams to 3 large joints, up to 3 times daily. (Patient not taking: Reported on 04/04/2023) 3 Tube 3   enalapril (VASOTEC) 20 MG tablet Take 20 mg by mouth daily.     tamsulosin (FLOMAX) 0.4 MG CAPS capsule Take 1 capsule (0.4 mg total) by mouth daily. 30 capsule 3   No current facility-administered medications on file prior to visit.    Past Surgical History:  Procedure Laterality Date   CIRCUMCISION N/A 12/15/2012   Procedure: CIRCUMCISION ADULT;  Surgeon: Lindaann Slough, MD;  Location: [facility];  Service: Urology;  Laterality: N/A;    Allergies  Allergen Reactions   Novocain [Procaine Hcl] Itching    Tramadol Itching    There were no vitals taken for this visit.      No data to display              No data to display              Objective:  Physical Exam:  Gen: NAD, comfortable in exam room  Elbow, right: Patient reports TTP at the lateral epicondyle. Inspection yields no evidence of bony deformity, effusion, erythema, ecchymosis, or rash. Active and passive ROM intact in flexion/extension/supination/pronation. Strength 5/5 throughout. Tenderness of the lateral epicondyle. Pain with finger and wrist extention against resistance. Mild pain with gripping against resistance. No evidence of pain or laxity at the UCL.   Assessment & Plan:  1. Lateral epicondylitis  - Continue counterforce brace. Prednisone 10 mg for 6 days. Home elbow exercises given to patient. Follow up in 2-4 weeks if not improving.  FELLOW ATTESTATION: I personally evaluated the patient with the resident and agree with the above documentation with the following emphasis: 10-month history of lateral elbow pain at the epicondyle.  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  Pain with wrist extension and third finger extension.  This is consistent with lateral epicondylitis.  Will treat with prednisone taper, counterforce brace, home exercise plan.  Follow-up in 4 weeks.  If  no improvement, will consider an injection or nitro patches or shockwave.  Baldemar Friday Rafoth 04/11/2023  Addendum:  I was the preceptor for this visit and available for immediate consultation.  Norton Blizzard MD Marrianne Mood

## 2023-04-28 ENCOUNTER — Telehealth: Payer: Self-pay | Admitting: Medical

## 2023-04-28 MED ORDER — ENALAPRIL MALEATE 20 MG PO TABS: 20.0000 mg | ORAL_TABLET | Freq: Every day | ORAL | 3 refills | Status: DC

## 2023-04-28 NOTE — Telephone Encounter (Signed)
Patient called and would like a med refill  on  enalapril (VASOTEC) 20 MG tablet and sent to [facility] on [address].

## 2023-04-28 NOTE — Addendum Note (Signed)
Addended by: Gwenevere Abbot on: 04/28/2023 05:15 PM   Modules accepted: Orders

## 2023-04-29 NOTE — Telephone Encounter (Signed)
Pt.notified

## 2023-05-09 ENCOUNTER — Encounter: Payer: Self-pay | Admitting: Sports Medicine

## 2023-05-09 ENCOUNTER — Ambulatory Visit: Payer: 59 | Admitting: Sports Medicine

## 2023-05-09 VITALS — BP 110/68 | HR 61 | Ht 66.0 in | Wt 180.0 lb

## 2023-05-09 DIAGNOSIS — M7711 Lateral epicondylitis, right elbow: Secondary | ICD-10-CM

## 2023-05-09 NOTE — Progress Notes (Signed)
   Subjective:    Patient ID: Alfred Tucker, male    DOB: 16-Nov-1960, 62 y.o.   MRN: [id number]  HPI Follow up on right elbow lateral epicondylitis. No improvement with HEP and prednisone. Counterforce brace helps. Still working at the post office.   Review of Systems     Objective:   Physical Exam  Right elbow: TTP at lateral epicondyle. Reproducible pain with ECRB testing. No swelling      Assessment & Plan:   Right elbow lateral epicondylitis  PT at [facility]. Continue counterforce brace. Ok to use ibuprofen once prednisone is complete. My Chart message me 4 weeks after starting PT for a check on progress. If no improvement, consider referral to ortho.

## 2023-07-31 ENCOUNTER — Other Ambulatory Visit: Payer: Self-pay | Admitting: Medical

## 2023-08-04 ENCOUNTER — Encounter: Payer: Self-pay | Admitting: Medical

## 2023-08-04 NOTE — Telephone Encounter (Signed)
Will you try to call pt an advise I'm the below.  In th past I had refilled tamsulosin. I was not aware you were getting afluzosin from your urolologist. Those meds are in the same class. Your insurance sent me notice  of this. Advise you stop tamsulosin and take the afluzosin. Follow up with your urologist regarding urinary symptoms. I will defer to urologist.

## 2023-08-29 ENCOUNTER — Other Ambulatory Visit: Payer: Self-pay | Admitting: Medical

## 2023-09-24 ENCOUNTER — Other Ambulatory Visit: Payer: Self-pay | Admitting: Medical

## 2023-10-22 ENCOUNTER — Other Ambulatory Visit: Payer: Self-pay | Admitting: Medical

## 2024-02-26 ENCOUNTER — Ambulatory Visit: Admitting: Medical

## 2024-02-26 ENCOUNTER — Encounter: Payer: Self-pay | Admitting: Medical

## 2024-02-26 VITALS — BP 129/80 | HR 64 | Temp 98.1°F | Resp 16 | Ht 66.0 in | Wt 168.0 lb

## 2024-02-26 DIAGNOSIS — N401 Enlarged prostate with lower urinary tract symptoms: Secondary | ICD-10-CM

## 2024-02-26 DIAGNOSIS — Z7984 Long term (current) use of oral hypoglycemic drugs: Secondary | ICD-10-CM | POA: Diagnosis not present

## 2024-02-26 DIAGNOSIS — E785 Hyperlipidemia, unspecified: Secondary | ICD-10-CM | POA: Diagnosis not present

## 2024-02-26 DIAGNOSIS — E119 Type 2 diabetes mellitus without complications: Secondary | ICD-10-CM | POA: Diagnosis not present

## 2024-02-26 DIAGNOSIS — I10 Essential (primary) hypertension: Secondary | ICD-10-CM

## 2024-02-26 DIAGNOSIS — M1000 Idiopathic gout, unspecified site: Secondary | ICD-10-CM

## 2024-02-26 DIAGNOSIS — R3911 Hesitancy of micturition: Secondary | ICD-10-CM

## 2024-02-26 LAB — COMPREHENSIVE METABOLIC PANEL WITH GFR
ALT: 21 U/L (ref 0–53)
AST: 20 U/L (ref 0–37)
Albumin: 4.7 g/dL (ref 3.5–5.2)
Alkaline Phosphatase: 37 U/L — ABNORMAL LOW (ref 39–117)
BUN: 15 mg/dL (ref 6–23)
CO2: 29 meq/L (ref 19–32)
Calcium: 9.9 mg/dL (ref 8.4–10.5)
Chloride: 101 meq/L (ref 96–112)
Creatinine, Ser: 0.98 mg/dL (ref 0.40–1.50)
GFR: 82.76 mL/min (ref >60.00)
Glucose, Bld: 112 mg/dL — ABNORMAL HIGH (ref 70–99)
Potassium: 5.1 meq/L (ref 3.5–5.1)
Sodium: 137 meq/L (ref 135–145)
Total Bilirubin: 0.9 mg/dL (ref 0.2–1.2)
Total Protein: 7 g/dL (ref 6.0–8.3)

## 2024-02-26 LAB — URIC ACID: Uric Acid, Serum: 7.4 mg/dL (ref 4.0–7.8)

## 2024-02-26 LAB — LIPID PANEL
Cholesterol: 153 mg/dL (ref 0–200)
HDL: 50.2 mg/dL (ref >39.00)
LDL Cholesterol: 93 mg/dL (ref 0–99)
NonHDL: 102.98
Total CHOL/HDL Ratio: 3
Triglycerides: 48 mg/dL (ref 0.0–149.0)
VLDL: 9.6 mg/dL (ref 0.0–40.0)

## 2024-02-26 LAB — HEMOGLOBIN A1C: Hgb A1c MFr Bld: 7.4 % — ABNORMAL HIGH (ref 4.6–6.5)

## 2024-02-26 LAB — MICROALBUMIN / CREATININE URINE RATIO
Creatinine,U: 107.4 mg/dL
Microalb Creat Ratio: UNDETERMINED mg/g (ref 0.0–30.0)
Microalb, Ur: <0.7 mg/dL

## 2024-02-26 MED ORDER — ENALAPRIL MALEATE 20 MG PO TABS: 20.0000 mg | ORAL_TABLET | Freq: Every day | ORAL | 3 refills | Status: AC

## 2024-02-26 MED ORDER — ATORVASTATIN CALCIUM 10 MG PO TABS: 10.0000 mg | ORAL_TABLET | Freq: Every day | ORAL | 3 refills | Status: AC

## 2024-02-26 MED ORDER — COLCHICINE 0.6 MG PO TABS: 0.6000 mg | ORAL_TABLET | Freq: Two times a day (BID) | ORAL | 3 refills | Status: AC

## 2024-02-26 MED ORDER — METFORMIN HCL 500 MG PO TABS: 500.0000 mg | ORAL_TABLET | Freq: Two times a day (BID) | ORAL | 3 refills | Status: AC

## 2024-02-26 NOTE — Progress Notes (Signed)
 Subjective:    Patient ID: Alfred Tucker, male    DOB: 1961-10-12, 63 y.o.   MRN: [id number]  HPI QUANTRELL SILBER is a 63 year old male with diabetes and benign prostatic hyperplasia who presents for follow-up of his diabetes management and urinary symptoms.  He has a history of diabetes and has been hesitant to take prescribed medications, including metformin  and Farxiga , due to concerns about side effects and a family history of kidney problems. His last A1c, ten months ago, was 7.4. He manages his condition through diet and exercise, reporting weight loss and exercising five to six days a week for about an hour and a half each session.  He experiences urinary symptoms, including difficulty urinating and nocturia, waking up two to three times a night, and recently every hour for five hours. He was seen by a nurse practitioner who prescribed Ditropan 5 mg, and he is currently on Flomax  (tamsulosin ) for benign prostatic hyperplasia. He plans to follow up with his urologist in about a month.  He experienced a gout flare in his right big toe after consuming beef sausage, which lasted about a week. He has a history of elevated uric acid levels and was previously on allopurinol  for gout prevention but has not used it recently. Gout flares are rare for him.  He is not currently taking cholesterol medication, although he was on it in the past. His current medications include enalapril  and tamsulosin .  Htn- bp controlled. Refilled enalpril today.    Review of Systems  Constitutional:  Negative for chills, fatigue and fever.  Respiratory:  Negative for cough, chest tightness and wheezing.   Cardiovascular:  Negative for chest pain and palpitations.  Gastrointestinal:  Negative for abdominal pain and blood in stool.  Genitourinary:  Negative for dysuria, frequency and penile pain.       See hpi. Has seen urologist recently.  Musculoskeletal:  Negative for back pain, joint swelling, myalgias and neck  pain.       Recent gout.  Skin:  Negative for rash.  Neurological:  Negative for dizziness, seizures, weakness and light-headedness.  Hematological:  Negative for adenopathy. Does not bruise/bleed easily.  Psychiatric/Behavioral:  Negative for behavioral problems and dysphoric mood.    Past Medical History:  Diagnosis Date   Arthritis    Borderline diabetes mellitus    diet controlled   Hypertension    Microhematuria    Phimosis      Social History   Socioeconomic History   Marital status: Widowed    Spouse name: Not on file   Number of children: Not on file   Years of education: Not on file   Highest education level: 12th grade  Occupational History   Not on file  Tobacco Use   Smoking status: Former    Current packs/day: 0.00    Average packs/day: 0.3 packs/day for 10.0 years (2.5 ttl pk-yrs)    Types: Cigarettes    Start date: 12/10/1982    Quit date: 12/10/1992    Years since quitting: 31.2   Smokeless tobacco: Never  Vaping Use   Vaping status: Never Used  Substance and Sexual Activity   Alcohol use: Yes    Comment: rarely    Drug use: No   Sexual activity: Not on file  Other Topics Concern   Not on file  Social History Narrative   Not on file   Social Drivers of Health   Financial Resource Strain: Low Risk  (02/23/2024)  Overall Financial Resource Strain (CARDIA)    Difficulty of Paying Living Expenses: Not hard at all  Food Insecurity: No Food Insecurity (02/23/2024)   Hunger Vital Sign    Worried About Running Out of Food in the Last Year: Never true    Ran Out of Food in the Last Year: Never true  Transportation Needs: No Transportation Needs (02/23/2024)   PRAPARE - Administrator, Civil Service (Medical): No    Lack of Transportation (Non-Medical): No  Physical Activity: Sufficiently Active (02/23/2024)   Exercise Vital Sign    Days of Exercise per Week: 4 days    Minutes of Exercise per Session: 70 min  Stress: No Stress Concern Present  (02/23/2024)   [employer] of Occupational Health - Occupational Stress Questionnaire    Feeling of Stress : Only a little  Social Connections: Unknown (02/23/2024)   Social Connection and Isolation Panel [NHANES]    Frequency of Communication with Friends and Family: Twice a week    Frequency of Social Gatherings with Friends and Family: Patient declined    Attends Religious Services: More than 4 times per year    Active Member of [employer] or Organizations: Not on file    Attends Banker Meetings: Not on file    Marital Status: Widowed  Intimate Partner Violence: Not on file    Past Surgical History:  Procedure Laterality Date   CIRCUMCISION N/A 12/15/2012   Procedure: CIRCUMCISION ADULT;  Surgeon: Jinny Mounts, MD;  Location: [facility];  Service: Urology;  Laterality: N/A;    Family History  Problem Relation Age of Onset   Diabetes Mother    Heart disease Mother    Diabetes Father    Stroke Sister    Diabetes Sister    Stroke Brother    Diabetes Brother    Diabetes Sister    Diabetes Brother     Allergies  Allergen Reactions   Novocain [Procaine Hcl] Itching   Tramadol Itching    Current Outpatient Medications on File Prior to Visit  Medication Sig Dispense Refill   alfuzosin (UROXATRAL) 10 MG 24 hr tablet Take 10 mg by mouth daily.     oxybutynin (DITROPAN) 5 MG tablet Take 5 mg by mouth daily.     allopurinol  (ZYLOPRIM ) 300 MG tablet Take 1 tablet (300 mg total) by mouth daily. 1 tablet by mouth daily. (Patient not taking: Reported on 04/04/2023) 30 tablet 3   atorvastatin  (LIPITOR) 10 MG tablet Take 1 tablet (10 mg total) by mouth daily. 30 tablet 3   dapagliflozin  propanediol (FARXIGA ) 10 MG TABS tablet Take 1 tablet (10 mg total) by mouth daily before breakfast. 30 tablet 11   diclofenac  sodium (VOLTAREN ) 1 % GEL Apply 3 grams to 3 large joints, up to 3 times daily. (Patient not taking: Reported on 04/04/2023) 3 Tube 3   tamsulosin   (FLOMAX ) 0.4 MG CAPS capsule Take 1 capsule by mouth once daily 30 capsule 0   No current facility-administered medications on file prior to visit.    BP 129/80   Pulse 64   Temp 98.1 F (36.7 C) (Oral)   Resp 16   Ht 5\' 6"  (1.676 m)   Wt 168 lb (76.2 kg)   SpO2 100%   BMI 27.12 kg/m        Objective:   Physical Exam  General Mental Status- Alert. General Appearance- Not in acute distress.   Skin General: Color- Normal Color. Moisture- Normal Moisture.  Neck Carotid Arteries- Normal color. Moisture- Normal Moisture. No carotid bruits. No JVD.  Chest and Lung Exam Auscultation: Breath Sounds:-Normal.  Cardiovascular Auscultation:Rythm- Regular. Murmurs & Other Heart Sounds:Auscultation of the heart reveals- No Murmurs.  Abdomen Inspection:-Inspeection Normal. Palpation/Percussion:Note:No mass. Palpation and Percussion of the abdomen reveal- Non Tender, Non Distended + BS, no rebound or guarding.   Neurologic Cranial Nerve exam:- CN III-XII intact(No nystagmus), symmetric smile. Strength:- 5/5 equal and symmetric strength both upper and lower extremities.       Assessment & Plan:   Patient Instructions  Type 2 diabetes mellitus Non-compliance with metformin  and Farxiga . Educated on benefits vs risk of meds. Overall more benefit. Emphasized risks of uncontrolled diabetes and importance of medication with diet and exercise. - Order A1c and metabolic panel. - Refer to diabetic education. -after lab review potential revisit rx med options. on review never tried meds in past.  Hypertension Well-controlled on current regimen. - Refill enalapril , 90 tablets with three refills.  Gout Recent flare resolved. Discussed colchicine  for acute flares and prevention. - Order uric acid level. - Prescribe colchicine , 60 tablets, for acute flares or daily prevention.  hyperlipidemia. - check lipid panel and continue atorvastatin .  Benign prostatic hyperplasia with  lower urinary tract symptoms Managed with Uroxatral and Ditropan. Scheduled urology follow-up. - Continue Uroxatral and Ditropan.(other meds urologist advised as well) - Follow up with urologist as scheduled.  Follow up with me in 3-6 months. Will depend on lab results.   Gayle Martinez, PA-C

## 2024-02-26 NOTE — Addendum Note (Signed)
 Addended by: Serafina Damme on: 02/26/2024 05:19 PM   Modules accepted: Orders

## 2024-02-26 NOTE — Patient Instructions (Signed)
 Type 2 diabetes mellitus Non-compliance with metformin  and Farxiga . Educated on benefits vs risk of meds. Overall more benefit. Emphasized risks of uncontrolled diabetes and importance of medication with diet and exercise. - Order A1c and metabolic panel. - Refer to diabetic education. -after lab review potential revisit rx med options. on review never tried meds in past.  Hypertension Well-controlled on current regimen. - Refill enalapril , 90 tablets with three refills.  Gout Recent flare resolved. Discussed colchicine  for acute flares and prevention. - Order uric acid level. - Prescribe colchicine , 60 tablets, for acute flares or daily prevention.  hyperlipidemia. - check lipid panel and continue atorvastatin .  Benign prostatic hyperplasia with lower urinary tract symptoms Managed with Uroxatral and Ditropan. Scheduled urology follow-up. - Continue Uroxatral and Ditropan.(other meds urologist advised as well) - Follow up with urologist as scheduled.  Follow up with me in 3-6 months. Will depend on lab results.

## 2024-04-12 ENCOUNTER — Ambulatory Visit: Admitting: Dietician

## 2024-05-03 ENCOUNTER — Other Ambulatory Visit: Payer: Self-pay | Admitting: Urology

## 2024-05-03 DIAGNOSIS — N401 Enlarged prostate with lower urinary tract symptoms: Secondary | ICD-10-CM

## 2024-05-12 ENCOUNTER — Other Ambulatory Visit

## 2024-05-12 ENCOUNTER — Ambulatory Visit
Admission: RE | Admit: 2024-05-12 | Discharge: 2024-05-12 | Disposition: A | Discharge status: Home / Self Care | Source: Ambulatory Visit | Attending: Urology | Admitting: Urology

## 2024-05-12 DIAGNOSIS — N401 Enlarged prostate with lower urinary tract symptoms: Secondary | ICD-10-CM

## 2024-05-16 NOTE — Consult Note (Signed)
 Chief Complaint: Consult for LUTS and prostate artery embolization.  Referring Provider(s): Wrenn,John   Patient Status: [facility]  History of Present Illness: Alfred Tucker is a 62 y.o. male is currently under the care of Dr Watt for LUTS.  He works as a Hospital doctor and his symptoms or urinary frequency and prolonged time of urination are making his job difficult.  More so his lack of sleep from frequent night time urinating.  His IPSS 18.  His QOL 6.  Even the time it takes to initiate urination post coitus is challenging his current lifestyle.  I discussed the preoperative regimen as well as the procedure and post procedure expectations with the patient in detail.  The risks and benefits were also discussed.  All of his questions were answered.  Verbal consent was obtained and written consent will be obtained on the day of the procedure.   Past Medical History:  Diagnosis Date   Arthritis    Borderline diabetes mellitus    diet controlled   Hypertension    Microhematuria    Phimosis     Past Surgical History:  Procedure Laterality Date   CIRCUMCISION N/A 12/15/2012   Procedure: CIRCUMCISION ADULT;  Surgeon: Thomasine Oiler, MD;  Location: [facility];  Service: Urology;  Laterality: N/A;    Allergies: Novocain [procaine hcl] and Tramadol  Medications: Prior to Admission medications   Medication Sig Start Date End Date Taking? Authorizing Provider  alfuzosin (UROXATRAL) 10 MG 24 hr tablet Take 10 mg by mouth daily. 02/05/24   [provider]  allopurinol  (ZYLOPRIM ) 300 MG tablet Take 1 tablet (300 mg total) by mouth daily. 1 tablet by mouth daily. Patient not taking: Reported on 04/04/2023 04/22/18   Dolphus Reiter, MD  atorvastatin  (LIPITOR) 10 MG tablet Take 1 tablet (10 mg total) by mouth daily. 04/05/23   Saguier, [place], PA-C  atorvastatin  (LIPITOR) 10 MG tablet Take 1 tablet (10 mg total) by mouth daily. 02/26/24   Saguier, [place], PA-C   colchicine  0.6 MG tablet Take 1 tablet (0.6 mg total) by mouth 2 (two) times daily. 02/26/24   Saguier, [place], PA-C  dapagliflozin  propanediol (FARXIGA ) 10 MG TABS tablet Take 1 tablet (10 mg total) by mouth daily before breakfast. 04/05/23   Saguier, [place], PA-C  diclofenac  sodium (VOLTAREN ) 1 % GEL Apply 3 grams to 3 large joints, up to 3 times daily. Patient not taking: Reported on 04/04/2023 03/04/18   Dolphus Reiter, MD  enalapril  (VASOTEC ) 20 MG tablet Take 1 tablet (20 mg total) by mouth daily. 02/26/24   Saguier, [place], PA-C  metFORMIN  (GLUCOPHAGE ) 500 MG tablet Take 1 tablet (500 mg total) by mouth 2 (two) times daily with a meal. 02/26/24   Saguier, [place], PA-C  oxybutynin (DITROPAN) 5 MG tablet Take 5 mg by mouth daily. 02/24/24   [provider]  tamsulosin  (FLOMAX ) 0.4 MG CAPS capsule Take 1 capsule by mouth once daily 09/24/23   Saguier, [place], PA-C     Family History  Problem Relation Age of Onset   Diabetes Mother    Heart disease Mother    Diabetes Father    Stroke Sister    Diabetes Sister    Stroke Brother    Diabetes Brother    Diabetes Sister    Diabetes Brother     Social History   Socioeconomic History   Marital status: Widowed    Spouse name: Not on file   Number of children: Not on file  Years of education: Not on file   Highest education level: 12th grade  Occupational History   Not on file  Tobacco Use   Smoking status: Former    Current packs/day: 0.00    Average packs/day: 0.3 packs/day for 10.0 years (2.5 ttl pk-yrs)    Types: Cigarettes    Start date: 12/10/1982    Quit date: 12/10/1992    Years since quitting: 31.4   Smokeless tobacco: Never  Vaping Use   Vaping status: Never Used  Substance and Sexual Activity   Alcohol use: Yes    Comment: rarely    Drug use: No   Sexual activity: Not on file  Other Topics Concern   Not on file  Social History Narrative   Not on file   Social Drivers of Health   Financial Resource Strain:  Low Risk  (02/23/2024)   Overall Financial Resource Strain (CARDIA)    Difficulty of Paying Living Expenses: Not hard at all  Food Insecurity: No Food Insecurity (02/23/2024)   Hunger Vital Sign    Worried About Running Out of Food in the Last Year: Never true    Ran Out of Food in the Last Year: Never true  Transportation Needs: No Transportation Needs (02/23/2024)   PRAPARE - Administrator, Civil Service (Medical): No    Lack of Transportation (Non-Medical): No  Physical Activity: Sufficiently Active (02/23/2024)   Exercise Vital Sign    Days of Exercise per Week: 4 days    Minutes of Exercise per Session: 70 min  Stress: No Stress Concern Present (02/23/2024)   [employer] of Occupational Health - Occupational Stress Questionnaire    Feeling of Stress : Only a little  Social Connections: Unknown (02/23/2024)   Social Connection and Isolation Panel    Frequency of Communication with Friends and Family: Twice a week    Frequency of Social Gatherings with Friends and Family: Patient declined    Attends Religious Services: More than 4 times per year    Active Member of [employer] or Organizations: Not on file    Attends Banker Meetings: Not on file    Marital Status: Widowed     Review of Systems: A 12 point ROS discussed and pertinent positives are indicated in the HPI above.  All other systems are negative.  Review of Systems  All other systems reviewed and are negative.   Vital Signs: BP 114/72 (BP Location: Left Arm)   Pulse 64   Temp 98 F (36.7 C) (Oral)   Resp 17   SpO2 94%     Physical Exam Constitutional:      Appearance: Normal appearance.  HENT:     Head: Normocephalic and atraumatic.  Cardiovascular:     Rate and Rhythm: Normal rate and regular rhythm.     Heart sounds: Normal heart sounds.  Pulmonary:     Effort: Pulmonary effort is normal.     Breath sounds: Normal breath sounds.  Skin:    General: Skin is warm and dry.   Neurological:     Mental Status: He is alert.  Psychiatric:        Mood and Affect: Mood normal.        Behavior: Behavior normal.        Thought Content: Thought content normal.        Judgment: Judgment normal.     Imaging: No results found.  Labs:  CBC: No results for input(s): WBC, HGB, HCT, PLT in  the last 8760 hours.  COAGS: No results for input(s): INR, APTT in the last 8760 hours.  BMP: Recent Labs    02/26/24 0950  NA 137  K 5.1  CL 101  CO2 29  GLUCOSE 112*  BUN 15  CALCIUM  9.9  CREATININE 0.98    LIVER FUNCTION TESTS: Recent Labs    02/26/24 0950  BILITOT 0.9  AST 20  ALT 21  ALKPHOS 37*  PROT 7.0  ALBUMIN 4.7    TUMOR MARKERS: No results for input(s): AFPTM, CEA, CA199, CHROMGRNA in the last 8760 hours.  Assessment and Plan:  LUTS secondary to prostatic hypertrophy.  I feel the patient would likely benefit from prostatic artery embolization.  He will get a CT pelvis prostate protocol, BMP, CBC.  After his CT has been reviewed by me, I will discuss with the schedulers about getting the embolization procedure scheduled and where it should take place.     Electronically Signed: Cordella DELENA Banner, MD   05/16/2024, 10:54 PM     I spent a total of  40 Minutes   in face to face in clinical consultation, greater than 50% of which was counseling/coordinating care for prostatic artery embolization.

## 2024-05-18 ENCOUNTER — Other Ambulatory Visit: Payer: Self-pay

## 2024-05-18 DIAGNOSIS — N401 Enlarged prostate with lower urinary tract symptoms: Secondary | ICD-10-CM

## 2024-05-18 DIAGNOSIS — N400 Enlarged prostate without lower urinary tract symptoms: Secondary | ICD-10-CM

## 2024-05-19 ENCOUNTER — Inpatient Hospital Stay
Admission: RE | Admit: 2024-05-19 | Discharge: 2024-05-19 | Discharge status: Home / Self Care | Source: Ambulatory Visit

## 2024-05-19 DIAGNOSIS — N401 Enlarged prostate with lower urinary tract symptoms: Secondary | ICD-10-CM

## 2024-05-19 MED ORDER — IOPAMIDOL (ISOVUE-370) INJECTION 76%
100.0000 mL | Freq: Once | INTRAVENOUS | Status: AC | PRN
Start: 2024-05-19 — End: 2024-05-19
  Administered 2024-05-19: 100 mL via INTRAVENOUS

## 2024-05-31 ENCOUNTER — Encounter: Attending: Medical | Admitting: Dietician

## 2024-05-31 ENCOUNTER — Encounter: Payer: Self-pay | Admitting: Dietician

## 2024-05-31 DIAGNOSIS — E119 Type 2 diabetes mellitus without complications: Secondary | ICD-10-CM | POA: Insufficient documentation

## 2024-05-31 NOTE — Progress Notes (Signed)
 Diabetes Self-Management Education  Visit Type: First/Initial  Appt. Start Time: 0900 Appt. End Time: 0955  05/31/2024  Mr. Alfred Tucker, identified by name and date of birth, is a 63 y.o. male with a diagnosis of Diabetes: Type 2.   ASSESSMENT Pt reports being prescribed metformin  @500  BID, Farxiga  @10  mg every day, but not taking any medications, prefers exercise and diet to control A1c, exercises 4-5 times a week (treadmill ~30 minutes, resistance exercises for ~60 minutes), reports weight loss of ~20 lbs over a year after DM diagnosis. Pt states they have stopped eating fastfood and fried foods, cooks all food at home (grills food on Sunday for the week), drinking lots of water eating avocado with lemon juice, tries not to eat after 6:00 PM before going into work. 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   Diabetes Self-Management Education - 05/31/24 0913       Visit Information   Visit Type First/Initial      Initial Visit   Diabetes Type Type 2    Date Diagnosed 2018    Are you currently following a meal plan? Yes    What type of meal plan do you follow? Low carb, no fried foods    Are you taking your medications as prescribed? No      Health Coping   How would you rate your overall health? Good      Psychosocial Assessment   Patient Belief/Attitude about Diabetes Motivated to manage diabetes    What is the hardest part about your diabetes right now, causing you the most concern, or is the most worrisome to you about your diabetes?   Making healty food and beverage choices    Self-care barriers None    Self-management support Doctor's office    Other persons present Patient    Patient Concerns Healthy Lifestyle    Special Needs None    Preferred Learning Style No preference indicated    Learning Readiness Change in progress    How often do you need to have someone help you  when you read instructions, pamphlets, or other written materials from your doctor or pharmacy? 1 - Never    What is the last grade level you completed in school? 12th grade      Pre-Education Assessment   Patient understands the diabetes disease and treatment process. Comprehends key points    Patient understands incorporating nutritional management into lifestyle. Comprehends key points    Patient undertands incorporating physical activity into lifestyle. Demonstrates understanding / competency    Patient understands using medications safely. N/A (comment)    Patient understands monitoring blood glucose, interpreting and using results N/A (comment)    Patient understands prevention, detection, and treatment of acute complications. Comprehends key points    Patient understands prevention, detection, and treatment of chronic complications. Compreheands key points    Patient understands how to develop strategies to address psychosocial issues. Comprehends key points    Patient understands how to develop strategies to promote health/change behavior. Comprehends key points      Complications   Last HgB A1C per patient/outside source 7.4 %   02/26/2024   How often do you check your blood sugar? 0 times/day (not testing)    Have you had a dilated eye exam in the past 12 months? No    Have you had a dental exam in the past 12 months?  Yes    Are you checking your feet? Yes    How many days per week are you checking your feet? 1      Dietary Intake   Breakfast Bowl of cheerios w/ vanilla almond milk    Snack (morning) 1 apple, 1 kiwi, avocado w/ lemon juice    Dinner Grilled chicken w/ salad    Beverage(s) Water      Activity / Exercise   Activity / Exercise Type ADL's;Moderate (swimming / aerobic walking)    How many days per week do you exercise? 5    How many minutes per day do you exercise? 60    Total minutes per week of exercise 300      Patient Education   Previous Diabetes Education  No    Disease Pathophysiology Explored patient's options for treatment of their diabetes    Healthy Eating Role of diet in the treatment of diabetes and the relationship between the three main macronutrients and blood glucose level;Meal options for control of blood glucose level and chronic complications.    Being Active Helped patient identify appropriate exercises in relation to his/her diabetes, diabetes complications and other health issue.    Chronic complications Relationship between chronic complications and blood glucose control    Diabetes Stress and Support Identified and addressed patients feelings and concerns about diabetes      Individualized Goals (developed by patient)   Nutrition Adjust meds/carbs with exercise as discussed    Physical Activity Exercise 5-7 days per week    Medications Not Applicable    Monitoring  Not Applicable      Post-Education Assessment   Patient understands the diabetes disease and treatment process. Demonstrates understanding / competency    Patient understands incorporating nutritional management into lifestyle. Demonstrates understanding / competency    Patient undertands incorporating physical activity into lifestyle. Demonstrates understanding / competency    Patient understands using medications safely. N/A    Patient understands monitoring blood glucose, interpreting and using results Demonstrates understanding / competency    Patient understands prevention, detection, and treatment of acute complications. Demonstrates understanding / competency    Patient understands prevention, detection, and treatment of chronic complications. Demonstrates understanding / competency    Patient understands how to develop strategies to address psychosocial issues. Demonstrates understanding / competency    Patient understands how to develop strategies to promote health/change behavior. Demonstrates understanding / competency      Outcomes   Expected Outcomes  Demonstrated interest in learning. Expect positive outcomes    Future DMSE PRN    Program Status Completed          Individualized Plan for Diabetes Self-Management Training:   Learning Objective:  Patient will have a greater understanding of diabetes self-management. Patient education plan is to attend individual and/or group sessions per assessed needs and concerns.   Plan:   Patient Instructions  Choose Fairlife milk to have with your cereal after the gym. Choose skim milk, if not tolerable move to 2%.  Balance your fruit with either fat-free greek yogurt (Danon Light n' Fit, OIKOS Triple Zero) or 100 calorie nut snack packs (Emerald).  Move 1 serving of fruit to 30-60 minutes before you workout!  Keep up the great work!       Expected Outcomes:  Demonstrated interest in learning. Expect positive outcomes  Education material provided: ADA - How to Thrive: A Guide for Your Journey with Diabetes, Protein food list  If problems or questions, patient to contact  team via:  Phone and Email  Future DSME appointment: PRN

## 2024-05-31 NOTE — Patient Instructions (Addendum)
 Choose Fairlife milk to have with your cereal after the gym. Choose skim milk, if not tolerable move to 2%.  Balance your fruit with either fat-free greek yogurt (Danon Light n' Fit, OIKOS Triple Zero) or 100 calorie nut snack packs (Emerald).  Move 1 serving of fruit to 30-60 minutes before you workout!  Keep up the great work!

## 2024-06-14 ENCOUNTER — Other Ambulatory Visit (HOSPITAL_COMMUNITY): Payer: Self-pay

## 2024-06-14 DIAGNOSIS — N401 Enlarged prostate with lower urinary tract symptoms: Secondary | ICD-10-CM

## 2024-07-06 ENCOUNTER — Other Ambulatory Visit (HOSPITAL_COMMUNITY)

## 2024-07-12 ENCOUNTER — Other Ambulatory Visit: Payer: Self-pay | Admitting: Radiology

## 2024-07-14 ENCOUNTER — Other Ambulatory Visit: Payer: Self-pay | Admitting: Radiology

## 2024-07-14 DIAGNOSIS — N401 Enlarged prostate with lower urinary tract symptoms: Secondary | ICD-10-CM

## 2024-07-14 NOTE — H&P (Signed)
 Chief Complaint: Benign prostatic hyperplasia with lower urinary tract symptoms; referred for pelvic arteriogram with bilateral prostate artery embolization  Referring Provider(s): Watt PARAS  Supervising Physician: Jenna Hacker  Patient Status: [facility] - Out-pt  History of Present Illness: Alfred Tucker is a 63 y.o. male with PMH sig for arthritis, DM, HTN, microhematuria and BPH with LUTS. He was seen in consultation by Dr. Jenna on 05/12/24 to discuss treatment options for his BPH with LUTS and deemed an appropriate candidate for bilateral prostate artery embolization. He presents today for the procedure.   *** Patient is Full Code  Past Medical History:  Diagnosis Date   Arthritis    Borderline diabetes mellitus    diet controlled   Hypertension    Microhematuria    Phimosis     Past Surgical History:  Procedure Laterality Date   CIRCUMCISION N/A 12/15/2012   Procedure: CIRCUMCISION ADULT;  Surgeon: Thomasine Oiler, MD;  Location: [facility];  Service: Urology;  Laterality: N/A;    Allergies: Novocain [procaine hcl] and Tramadol  Medications: Prior to Admission medications   Medication Sig Start Date End Date Taking? Authorizing Provider  alfuzosin (UROXATRAL) 10 MG 24 hr tablet Take 10 mg by mouth daily. 02/05/24   [provider]  allopurinol  (ZYLOPRIM ) 300 MG tablet Take 1 tablet (300 mg total) by mouth daily. 1 tablet by mouth daily. Patient not taking: Reported on 04/04/2023 04/22/18   Dolphus Reiter, MD  atorvastatin  (LIPITOR) 10 MG tablet Take 1 tablet (10 mg total) by mouth daily. 04/05/23   Saguier, [place], PA-C  atorvastatin  (LIPITOR) 10 MG tablet Take 1 tablet (10 mg total) by mouth daily. 02/26/24   Saguier, [place], PA-C  colchicine  0.6 MG tablet Take 1 tablet (0.6 mg total) by mouth 2 (two) times daily. 02/26/24   Saguier, [place], PA-C  dapagliflozin  propanediol (FARXIGA ) 10 MG TABS tablet Take 1 tablet (10 mg total) by mouth daily  before breakfast. 04/05/23   Saguier, [place], PA-C  diclofenac  sodium (VOLTAREN ) 1 % GEL Apply 3 grams to 3 large joints, up to 3 times daily. Patient not taking: Reported on 04/04/2023 03/04/18   Dolphus Reiter, MD  enalapril  (VASOTEC ) 20 MG tablet Take 1 tablet (20 mg total) by mouth daily. 02/26/24   Saguier, [place], PA-C  metFORMIN  (GLUCOPHAGE ) 500 MG tablet Take 1 tablet (500 mg total) by mouth 2 (two) times daily with a meal. 02/26/24   Saguier, [place], PA-C  oxybutynin (DITROPAN) 5 MG tablet Take 5 mg by mouth daily. 02/24/24   [provider]  tamsulosin  (FLOMAX ) 0.4 MG CAPS capsule Take 1 capsule by mouth once daily 09/24/23   Saguier, [place], PA-C     Family History  Problem Relation Age of Onset   Diabetes Mother    Heart disease Mother    Diabetes Father    Stroke Sister    Diabetes Sister    Stroke Brother    Diabetes Brother    Diabetes Sister    Diabetes Brother     Social History   Socioeconomic History   Marital status: Widowed    Spouse name: Not on file   Number of children: Not on file   Years of education: Not on file   Highest education level: 12th grade  Occupational History   Not on file  Tobacco Use   Smoking status: Former    Current packs/day: 0.00    Average packs/day: 0.3 packs/day for 10.0 years (2.5 ttl pk-yrs)  Types: Cigarettes    Start date: 12/10/1982    Quit date: 12/10/1992    Years since quitting: 31.6   Smokeless tobacco: Never  Vaping Use   Vaping status: Never Used  Substance and Sexual Activity   Alcohol use: Yes    Comment: rarely    Drug use: No   Sexual activity: Not on file  Other Topics Concern   Not on file  Social History Narrative   Not on file   Social Drivers of Health   Financial Resource Strain: Low Risk  (02/23/2024)   Overall Financial Resource Strain (CARDIA)    Difficulty of Paying Living Expenses: Not hard at all  Food Insecurity: No Food Insecurity (02/23/2024)   Hunger Vital Sign    Worried About  Running Out of Food in the Last Year: Never true    Ran Out of Food in the Last Year: Never true  Transportation Needs: No Transportation Needs (02/23/2024)   PRAPARE - Administrator, Civil Service (Medical): No    Lack of Transportation (Non-Medical): No  Physical Activity: Sufficiently Active (02/23/2024)   Exercise Vital Sign    Days of Exercise per Week: 4 days    Minutes of Exercise per Session: 70 min  Stress: No Stress Concern Present (02/23/2024)   [employer] of Occupational Health - Occupational Stress Questionnaire    Feeling of Stress : Only a little  Social Connections: Unknown (02/23/2024)   Social Connection and Isolation Panel    Frequency of Communication with Friends and Family: Twice a week    Frequency of Social Gatherings with Friends and Family: Patient declined    Attends Religious Services: More than 4 times per year    Active Member of [employer] or Organizations: Not on file    Attends Banker Meetings: Not on file    Marital Status: Widowed       Review of Systems  Vital Signs:   Advance Care Plan: no documents on file    Physical Exam  Imaging: No results found.  Labs:  CBC: No results for input(s): WBC, HGB, HCT, PLT in the last 8760 hours.  COAGS: No results for input(s): INR, APTT in the last 8760 hours.  BMP: Recent Labs    02/26/24 0950  NA 137  K 5.1  CL 101  CO2 29  GLUCOSE 112*  BUN 15  CALCIUM  9.9  CREATININE 0.98    LIVER FUNCTION TESTS: Recent Labs    02/26/24 0950  BILITOT 0.9  AST 20  ALT 21  ALKPHOS 37*  PROT 7.0  ALBUMIN 4.7    TUMOR MARKERS: No results for input(s): AFPTM, CEA, CA199, CHROMGRNA in the last 8760 hours.  Assessment and Plan: 63 y.o. male with PMH sig for arthritis, DM, HTN, microhematuria and BPH with LUTS. He was seen in consultation by Dr. Jenna on 05/12/24 to discuss treatment options for his BPH with LUTS and deemed an appropriate  candidate for bilateral prostate artery embolization. He presents today for the procedure. Risks and benefits of procedure were discussed with the patient including, but not limited to bleeding, infection, vascular injury or contrast induced renal failure.  This interventional procedure involves the use of X-rays and because of the nature of the planned procedure, it is possible that we will have prolonged use of X-ray fluoroscopy.  Potential radiation risks to you include (but are not limited to) the following: - A slightly elevated risk for cancer  several years later in  life. This risk is typically less than 0.5% percent. This risk is low in comparison to the normal incidence of human cancer, which is 33% for women and 50% for men according to the American Cancer Society. - Radiation induced injury can include skin redness, resembling a rash, tissue breakdown / ulcers and hair loss (which can be temporary or permanent).   The likelihood of either of these occurring depends on the difficulty of the procedure and whether you are sensitive to radiation due to previous procedures, disease, or genetic conditions.   IF your procedure requires a prolonged use of radiation, you will be notified and given written instructions for further action.  It is your responsibility to monitor the irradiated area for the 2 weeks following the procedure and to notify your physician if you are concerned that you have suffered a radiation induced injury.    All of the patient's questions were answered, patient is agreeable to proceed.  Consent signed and in chart.      Thank you for allowing our service to participate in Alfred Tucker 's care.  Electronically Signed: D. Franky Rakers, PA-C   07/14/2024, 11:23 AM      I spent a total of  25 minutes   in face to face in clinical consultation, greater than 50% of which was counseling/coordinating care for pelvic arteriogram with bilateral prostate artery  embolization

## 2024-07-15 ENCOUNTER — Ambulatory Visit (HOSPITAL_COMMUNITY)
Admission: RE | Admit: 2024-07-15 | Discharge: 2024-07-15 | Disposition: A | Discharge status: Home / Self Care | Source: Ambulatory Visit

## 2024-07-15 ENCOUNTER — Other Ambulatory Visit (HOSPITAL_COMMUNITY): Payer: Self-pay

## 2024-07-15 ENCOUNTER — Other Ambulatory Visit: Payer: Self-pay

## 2024-07-15 ENCOUNTER — Encounter (HOSPITAL_COMMUNITY): Payer: Self-pay

## 2024-07-15 DIAGNOSIS — Z79899 Other long term (current) drug therapy: Secondary | ICD-10-CM | POA: Insufficient documentation

## 2024-07-15 DIAGNOSIS — I10 Essential (primary) hypertension: Secondary | ICD-10-CM | POA: Insufficient documentation

## 2024-07-15 DIAGNOSIS — N401 Enlarged prostate with lower urinary tract symptoms: Secondary | ICD-10-CM

## 2024-07-15 DIAGNOSIS — Z7984 Long term (current) use of oral hypoglycemic drugs: Secondary | ICD-10-CM | POA: Insufficient documentation

## 2024-07-15 DIAGNOSIS — Z87891 Personal history of nicotine dependence: Secondary | ICD-10-CM | POA: Insufficient documentation

## 2024-07-15 DIAGNOSIS — E119 Type 2 diabetes mellitus without complications: Secondary | ICD-10-CM | POA: Insufficient documentation

## 2024-07-15 DIAGNOSIS — R3129 Other microscopic hematuria: Secondary | ICD-10-CM | POA: Diagnosis not present

## 2024-07-15 HISTORY — PX: IR 3D INDEPENDENT WKST: IMG2385

## 2024-07-15 HISTORY — PX: IR US GUIDE VASC ACCESS LEFT: IMG2389

## 2024-07-15 HISTORY — PX: IR ANGIOGRAM PELVIS SELECTIVE OR SUPRASELECTIVE: IMG661

## 2024-07-15 HISTORY — PX: IR EMBO TUMOR ORGAN ISCHEMIA INFARCT INC GUIDE ROADMAPPING: IMG5449

## 2024-07-15 HISTORY — PX: IR ANGIOGRAM SELECTIVE EACH ADDITIONAL VESSEL: IMG667

## 2024-07-15 LAB — BASIC METABOLIC PANEL WITH GFR
Anion gap: 9 (ref 5–15)
BUN: 10 mg/dL (ref 8–23)
CO2: 25 mmol/L (ref 22–32)
Calcium: 9.3 mg/dL (ref 8.9–10.3)
Chloride: 107 mmol/L (ref 98–111)
Creatinine, Ser: 0.82 mg/dL (ref 0.61–1.24)
GFR, Estimated: >60 mL/min (ref >60)
Glucose, Bld: 127 mg/dL — ABNORMAL HIGH (ref 70–99)
Potassium: 4.5 mmol/L (ref 3.5–5.1)
Sodium: 140 mmol/L (ref 135–145)

## 2024-07-15 LAB — CBC WITH DIFFERENTIAL/PLATELET
Abs Immature Granulocytes: 0.02 K/uL (ref 0.00–0.07)
Basophils Absolute: 0 K/uL (ref 0.0–0.1)
Basophils Relative: 1 %
Eosinophils Absolute: 0.1 K/uL (ref 0.0–0.5)
Eosinophils Relative: 2 %
HCT: 47.5 % (ref 39.0–52.0)
Hemoglobin: 15.1 g/dL (ref 13.0–17.0)
Immature Granulocytes: 0 %
Lymphocytes Relative: 44 %
Lymphs Abs: 2.6 K/uL (ref 0.7–4.0)
MCH: 28.4 pg (ref 26.0–34.0)
MCHC: 31.8 g/dL (ref 30.0–36.0)
MCV: 89.5 fL (ref 80.0–100.0)
Monocytes Absolute: 0.7 K/uL (ref 0.1–1.0)
Monocytes Relative: 11 %
Neutro Abs: 2.5 K/uL (ref 1.7–7.7)
Neutrophils Relative %: 42 %
Platelets: 184 K/uL (ref 150–400)
RBC: 5.31 MIL/uL (ref 4.22–5.81)
RDW: 13.4 % (ref 11.5–15.5)
WBC: 6 K/uL (ref 4.0–10.5)
nRBC: 0 % (ref 0.0–0.2)

## 2024-07-15 LAB — PROTIME-INR
INR: 1 (ref 0.8–1.2)
Prothrombin Time: 13.4 s (ref 11.4–15.2)

## 2024-07-15 LAB — PSA: Prostatic Specific Antigen: 0.87 ng/mL (ref 0.00–4.00)

## 2024-07-15 MED ORDER — PHENAZOPYRIDINE HCL 100 MG PO TABS
100.0000 mg | ORAL_TABLET | Freq: Three times a day (TID) | ORAL | 0 refills | Status: AC | PRN
  Filled 2024-07-15: qty 10, 4d supply, fill #0

## 2024-07-15 MED ORDER — MIDAZOLAM HCL 2 MG/2ML IJ SOLN
INTRAMUSCULAR | Status: AC
  Filled 2024-07-15: qty 2

## 2024-07-15 MED ORDER — CHLORHEXIDINE GLUCONATE CLOTH 2 % EX PADS: 6.0000 | MEDICATED_PAD | Freq: Every day | CUTANEOUS | Status: DC

## 2024-07-15 MED ORDER — NITROGLYCERIN IN D5W 100-5 MCG/ML-% IV SOLN
INTRAVENOUS | Status: AC
  Filled 2024-07-15: qty 250

## 2024-07-15 MED ORDER — FENTANYL CITRATE (PF) 100 MCG/2ML IJ SOLN
INTRAMUSCULAR | Status: AC
  Filled 2024-07-15: qty 2

## 2024-07-15 MED ORDER — IOHEXOL 300 MG/ML  SOLN
250.0000 mL | Freq: Once | INTRAMUSCULAR | Status: AC | PRN
  Administered 2024-07-15: 100 mL

## 2024-07-15 MED ORDER — SODIUM CHLORIDE 0.9 % IV SOLN
2.0000 g | Freq: Once | INTRAVENOUS | Status: AC
  Administered 2024-07-15: 2 g via INTRAVENOUS
  Filled 2024-07-15: qty 20

## 2024-07-15 MED ORDER — VERAPAMIL HCL 2.5 MG/ML IV SOLN
INTRAVENOUS | Status: AC
  Filled 2024-07-15: qty 2

## 2024-07-15 MED ORDER — FENTANYL CITRATE (PF) 100 MCG/2ML IJ SOLN
INTRAMUSCULAR | Status: AC | PRN
  Administered 2024-07-15: 50 ug via INTRAVENOUS
  Administered 2024-07-15 (×6): 25 ug via INTRAVENOUS

## 2024-07-15 MED ORDER — LIDOCAINE HCL (PF) 1 % IJ SOLN
INTRAMUSCULAR | Status: AC
  Filled 2024-07-15: qty 30

## 2024-07-15 MED ORDER — DOCUSATE SODIUM 100 MG PO CAPS
100.0000 mg | ORAL_CAPSULE | Freq: Two times a day (BID) | ORAL | 0 refills | Status: AC
  Filled 2024-07-15: qty 10, 5d supply, fill #0

## 2024-07-15 MED ORDER — NAPROXEN 500 MG PO TABS
500.0000 mg | ORAL_TABLET | Freq: Two times a day (BID) | ORAL | 0 refills | Status: AC
  Filled 2024-07-15: qty 14, 7d supply, fill #0

## 2024-07-15 MED ORDER — DEXAMETHASONE SODIUM PHOSPHATE 10 MG/ML IJ SOLN
8.0000 mg | Freq: Once | INTRAMUSCULAR | Status: AC
  Administered 2024-07-15: 8 mg via INTRAVENOUS

## 2024-07-15 MED ORDER — DEXAMETHASONE SODIUM PHOSPHATE 4 MG/ML IJ SOLN
INTRAMUSCULAR | Status: AC
  Filled 2024-07-15: qty 2

## 2024-07-15 MED ORDER — CIPROFLOXACIN HCL 500 MG PO TABS
500.0000 mg | ORAL_TABLET | Freq: Two times a day (BID) | ORAL | 0 refills | Status: AC
  Filled 2024-07-15: qty 14, 7d supply, fill #0

## 2024-07-15 MED ORDER — METHYLPREDNISOLONE 4 MG PO TBPK
ORAL_TABLET | ORAL | 0 refills | Status: AC
  Filled 2024-07-15: qty 21, 6d supply, fill #0

## 2024-07-15 MED ORDER — LIDOCAINE HCL (PF) 1 % IJ SOLN
10.0000 mL | Freq: Once | INTRAMUSCULAR | Status: AC
  Administered 2024-07-15: 2 mL

## 2024-07-15 MED ORDER — MIDAZOLAM HCL 2 MG/2ML IJ SOLN
INTRAMUSCULAR | Status: AC | PRN
  Administered 2024-07-15 (×6): .5 mg via INTRAVENOUS
  Administered 2024-07-15: 1 mg via INTRAVENOUS

## 2024-07-15 MED ORDER — SODIUM CHLORIDE 0.9 % IV SOLN: INTRAVENOUS | Status: DC

## 2024-07-15 MED ORDER — HEPARIN SODIUM (PORCINE) 1000 UNIT/ML IJ SOLN
INTRAMUSCULAR | Status: AC
  Filled 2024-07-15: qty 10

## 2024-07-15 MED ORDER — HYDROCODONE-ACETAMINOPHEN 5-325 MG PO TABS
1.0000 | ORAL_TABLET | Freq: Four times a day (QID) | ORAL | 0 refills | Status: AC | PRN
  Filled 2024-07-15: qty 20, 5d supply, fill #0

## 2024-07-15 NOTE — Procedures (Signed)
 Interventional Radiology Procedure Note  Procedure: Prostatic artery embolization  Complications: None  Estimated Blood Loss: < 10 mL  Findings: Bilateral embolization.  The left side was embolized with particles and coils.  The right side had numerous collateral channels (medial sacral, penile, cross filling to the left, and comm with the right epigastric arteries so embolization with coils only.  Cordella DELENA Banner, MD

## 2024-07-15 NOTE — Sedation Documentation (Signed)
 Patient transported back to short post procedural PAE. Left wrist TR Band in place. 11 ml of air at 1327. No drainage noted from site. Intact +2 radial pulses. Soft to palpation, no hematoma noted.

## 2024-07-15 NOTE — Progress Notes (Signed)
 Patient ID: Alfred Tucker, male   DOB: 1961-01-28, 63 y.o.   MRN: [id number] Per order of Dr. Jenna electronic prescriptions were sent to [facility] for Pyridium , Cipro , Medrol  Dosepak, Naprosyn , Vicodin and Colace.

## 2024-07-15 NOTE — Discharge Instructions (Signed)
 Please call Interventional Radiology clinic [phone] with any questions or concerns.  You may remove your dressing and shower tomorrow.  After the procedure, it is common to have: Increased frequency of urination Mild pressure or discomfort in the low belly Blood in the urine and/or painful urination for 7-14 days  Blood in the semen for 7-21 days Pressure, pain, or the urge to have a bowel movement when urinating and/or a small amount of blood in the stool for 4-7 days Low-grade fever (temperature less than 101.5 degrees Fahrenheit or 38.6 degrees Celsius) for 4-7 days Tenderness and/or bruising around the puncture site. Bruising can take 2-3 weeks to go away completely. Please call immediately if you feel a lump that is growing or varies with your pulse  Follow these instructions at home:  Medication: Do not use Aspirin or ibuprofen products, such as Advil or Motrin, as it may increase bleeding You may resume your usual medications as ordered by your doctor If your doctor prescribed antibiotics, take them as directed. Do not stop taking them just because you feel better  Eating and drinking: Drink plenty of liquids to keep your urine pale yellow You can resume your regular diet as directed by your doctor   Activity For procedures where we entered your artery from the top of your leg (groin):  Avoid strenuous activity, such as climbing long flights of stairs, for 24 hours after your procedure  No heavy lifting (greater than 15-20 pounds or 6-9 kg) for 7 days or until the puncture site heels Do not take baths, swim, or use a hot tub until your health care provider approves. Take showers only Keep all follow-up visits as told by your doctor  Care of the procedure site Follow instructions from your health care provider about how to take care of the puncture site.  Wash your hands with soap and water before you change your bandage (dressing). If soap and water are not available, use  hand sanitizer Change your dressing as told by your health care provider Leave stitches (sutures), skin glue, or adhesive strips in place. These skin closures may need to stay in place for 2 weeks or longer. If adhesive strip edges start to loosen and curl up, you may trim the loose edges. Do not remove adhesive strips completely unless your health care provider tells you to do that Check your puncture site every day for signs of infection. Check for: More redness, swelling, or pain Warmth/heat at puncture site Pus or a bad odor  Contact a health care provider if: You have a fever You have more redness, swelling, or pain around your incision You have more fluid or blood coming from your incision site Your incision feels warm to the touch You have pus or a bad smell coming from your incision You have a rash You have nausea, or you cannot eat or drink anything without vomiting  Get help right away if: You have trouble breathing You have chest pain You have severe pain in your abdomen, and it does not get better with medicine You have leg pain or leg swelling You feel dizzy, or you faint   Moderate Conscious Sedation-Care After  This sheet gives you information about how to care for yourself after your procedure. Your health care provider may also give you more specific instructions. If you have problems or questions, contact your health care provider.  After the procedure, it is common to have: Sleepiness for several hours. Impaired judgment for several hours.  Difficulty with balance. Vomiting if you eat too soon.  Follow these instructions at home:  Rest. Do not participate in activities where you could fall or become injured. Do not drive or use machinery. Do not drink alcohol. Do not take sleeping pills or medicines that cause drowsiness. Do not make important decisions or sign legal documents. Do not take care of children on your own.  Eating and drinking Follow the  diet recommended by your health care provider. Drink enough fluid to keep your urine pale yellow. If you vomit: Drink water, juice, or soup when you can drink without vomiting. Make sure you have little or no nausea before eating solid foods.  General instructions Take over-the-counter and prescription medicines only as told by your health care provider. Have a responsible adult stay with you for the time you are told. It is important to have someone help care for you until you are awake and alert. Do not smoke. Keep all follow-up visits as told by your health care provider. This is important.  Contact a health care provider if: You are still sleepy or having trouble with balance after 24 hours. You feel light-headed. You keep feeling nauseous or you keep vomiting. You develop a rash. You have a fever. You have redness or swelling around the IV site.  Get help right away if: You have trouble breathing. You have new-onset confusion at home.  This information is not intended to replace advice given to you by your health care provider. Make sure you discuss any questions you have with your healthcare provider.

## 2024-07-16 ENCOUNTER — Encounter (HOSPITAL_COMMUNITY): Payer: Self-pay
